# Patient Record
Sex: Male | Born: 1990 | Race: White | Hispanic: No | Marital: Single | Smoking: Current every day smoker
Health system: Southern US, Community
[De-identification: ages and names within clinical notes are randomized; demographics above are authoritative.]

## PROBLEM LIST (undated history)

## (undated) DIAGNOSIS — J9383 Other pneumothorax: Secondary | ICD-10-CM

## (undated) HISTORY — PX: LUNG SURGERY: SHX703

## (undated) HISTORY — PX: TONSILLECTOMY: SUR1361

---

## 2013-03-29 DIAGNOSIS — J939 Pneumothorax, unspecified: Secondary | ICD-10-CM | POA: Insufficient documentation

## 2013-08-23 DIAGNOSIS — F17200 Nicotine dependence, unspecified, uncomplicated: Secondary | ICD-10-CM | POA: Insufficient documentation

## 2016-07-24 ENCOUNTER — Emergency Department: Payer: No Typology Code available for payment source

## 2016-07-24 ENCOUNTER — Emergency Department
Admission: EM | Admit: 2016-07-24 | Discharge: 2016-07-24 | Disposition: A | Discharge status: Home / Self Care | Payer: No Typology Code available for payment source | Attending: Emergency Medicine | Admitting: Emergency Medicine

## 2016-07-24 DIAGNOSIS — S0101XA Laceration without foreign body of scalp, initial encounter: Secondary | ICD-10-CM

## 2016-07-24 DIAGNOSIS — F1910 Other psychoactive substance abuse, uncomplicated: Secondary | ICD-10-CM | POA: Insufficient documentation

## 2016-07-24 DIAGNOSIS — R400 Somnolence: Secondary | ICD-10-CM | POA: Diagnosis not present

## 2016-07-24 DIAGNOSIS — F17200 Nicotine dependence, unspecified, uncomplicated: Secondary | ICD-10-CM | POA: Insufficient documentation

## 2016-07-24 DIAGNOSIS — Y92410 Unspecified street and highway as the place of occurrence of the external cause: Secondary | ICD-10-CM | POA: Diagnosis not present

## 2016-07-24 DIAGNOSIS — Y999 Unspecified external cause status: Secondary | ICD-10-CM | POA: Diagnosis not present

## 2016-07-24 DIAGNOSIS — S0990XA Unspecified injury of head, initial encounter: Secondary | ICD-10-CM | POA: Diagnosis present

## 2016-07-24 DIAGNOSIS — Y939 Activity, unspecified: Secondary | ICD-10-CM | POA: Insufficient documentation

## 2016-07-24 HISTORY — DX: Other pneumothorax: J93.83

## 2016-07-24 LAB — COMPREHENSIVE METABOLIC PANEL
ALK PHOS: 63 U/L (ref 38–126)
ALT: 20 U/L (ref 17–63)
ANION GAP: 6 (ref 5–15)
AST: 29 U/L (ref 15–41)
Albumin: 4.5 g/dL (ref 3.5–5.0)
BUN: 15 mg/dL (ref 6–20)
CALCIUM: 9.3 mg/dL (ref 8.9–10.3)
CO2: 28 mmol/L (ref 22–32)
Chloride: 106 mmol/L (ref 101–111)
Creatinine, Ser: 1.59 mg/dL — ABNORMAL HIGH (ref 0.61–1.24)
GFR calc Af Amer: >60 mL/min (ref >60)
GFR, EST NON AFRICAN AMERICAN: 59 mL/min — AB (ref >60)
Glucose, Bld: 140 mg/dL — ABNORMAL HIGH (ref 65–99)
Potassium: 3.8 mmol/L (ref 3.5–5.1)
SODIUM: 140 mmol/L (ref 135–145)
TOTAL PROTEIN: 7.5 g/dL (ref 6.5–8.1)
Total Bilirubin: 0.6 mg/dL (ref 0.3–1.2)

## 2016-07-24 LAB — URINALYSIS, COMPLETE (UACMP) WITH MICROSCOPIC
BACTERIA UA: NONE SEEN
Bilirubin Urine: NEGATIVE
Glucose, UA: NEGATIVE mg/dL
HGB URINE DIPSTICK: NEGATIVE
Ketones, ur: NEGATIVE mg/dL
Leukocytes, UA: NEGATIVE
NITRITE: NEGATIVE
PROTEIN: 100 mg/dL — AB
Specific Gravity, Urine: >1.046 — ABNORMAL HIGH (ref 1.005–1.030)
pH: 6 (ref 5.0–8.0)

## 2016-07-24 LAB — CBC WITH DIFFERENTIAL/PLATELET
BASOS ABS: 0.1 10*3/uL (ref 0–0.1)
BASOS PCT: 0 %
EOS ABS: 0.1 10*3/uL (ref 0–0.7)
Eosinophils Relative: 1 %
HEMATOCRIT: 45.4 % (ref 40.0–52.0)
Hemoglobin: 15.3 g/dL (ref 13.0–18.0)
Lymphocytes Relative: 10 %
Lymphs Abs: 1.4 10*3/uL (ref 1.0–3.6)
MCH: 29.1 pg (ref 26.0–34.0)
MCHC: 33.7 g/dL (ref 32.0–36.0)
MCV: 86.6 fL (ref 80.0–100.0)
MONO ABS: 0.9 10*3/uL (ref 0.2–1.0)
Monocytes Relative: 7 %
NEUTROS ABS: 11.2 10*3/uL — AB (ref 1.4–6.5)
Neutrophils Relative %: 82 %
PLATELETS: 192 10*3/uL (ref 150–440)
RBC: 5.24 MIL/uL (ref 4.40–5.90)
RDW: 14 % (ref 11.5–14.5)
WBC: 13.7 10*3/uL — ABNORMAL HIGH (ref 3.8–10.6)

## 2016-07-24 LAB — URINE DRUG SCREEN, QUALITATIVE (ARMC ONLY)
Amphetamines, Ur Screen: NOT DETECTED
Barbiturates, Ur Screen: NOT DETECTED
Benzodiazepine, Ur Scrn: NOT DETECTED
COCAINE METABOLITE, UR ~~LOC~~: POSITIVE — AB
Cannabinoid 50 Ng, Ur ~~LOC~~: POSITIVE — AB
MDMA (ECSTASY) UR SCREEN: NOT DETECTED
Methadone Scn, Ur: NOT DETECTED
OPIATE, UR SCREEN: POSITIVE — AB
PHENCYCLIDINE (PCP) UR S: NOT DETECTED
Tricyclic, Ur Screen: NOT DETECTED

## 2016-07-24 LAB — ETHANOL

## 2016-07-24 LAB — SALICYLATE LEVEL: Salicylate Lvl: <7 mg/dL (ref 2.8–30.0)

## 2016-07-24 LAB — LIPASE, BLOOD: LIPASE: 18 U/L (ref 11–51)

## 2016-07-24 LAB — ACETAMINOPHEN LEVEL

## 2016-07-24 MED ORDER — NALOXONE HCL 2 MG/2ML IJ SOSY
PREFILLED_SYRINGE | INTRAMUSCULAR | Status: AC
  Filled 2016-07-24: qty 2

## 2016-07-24 MED ORDER — NALOXONE HCL 0.4 MG/ML IJ SOLN
0.0400 mg | Freq: Once | INTRAMUSCULAR | Status: DC
  Filled 2016-07-24: qty 1

## 2016-07-24 MED ORDER — SODIUM CHLORIDE 0.9 % IV BOLUS (SEPSIS)
1000.0000 mL | Freq: Once | INTRAVENOUS | Status: AC
  Administered 2016-07-24: 1000 mL via INTRAVENOUS

## 2016-07-24 MED ORDER — IOPAMIDOL (ISOVUE-300) INJECTION 61%
125.0000 mL | Freq: Once | INTRAVENOUS | Status: AC | PRN
  Administered 2016-07-24: 125 mL via INTRAVENOUS

## 2016-07-24 NOTE — ED Notes (Signed)
Patient continues to be somnolent, while still easily arousable. MD aware.
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
## 2016-07-24 NOTE — ED Notes (Signed)
Patient attempting to use urinal - patient falling asleep during attempt.

## 2016-07-24 NOTE — ED Notes (Signed)
Patient becoming increasingly more somnolent. MD notified

## 2016-07-24 NOTE — ED Notes (Signed)
Reviewed d/c instructions, follow-up care with patient. Pt verbalized understanding.  

## 2016-07-24 NOTE — ED Notes (Signed)
ED Provider at bedside. 
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
## 2016-07-24 NOTE — ED Notes (Signed)
Patient's O2 sat dropped to 89% on RA. Patient placed on 2L [place]. MD notified

## 2016-07-26 LAB — TYPE AND SCREEN
ABO/RH(D): A POS
Antibody Screen: NEGATIVE
PT AG Type: POSITIVE

## 2019-06-15 ENCOUNTER — Emergency Department: Payer: Self-pay

## 2019-06-15 ENCOUNTER — Emergency Department
Admission: EM | Admit: 2019-06-15 | Discharge: 2019-06-15 | Disposition: A | Discharge status: Home / Self Care | Payer: Self-pay | Attending: Emergency Medicine | Admitting: Emergency Medicine

## 2019-06-15 ENCOUNTER — Encounter: Payer: Self-pay | Admitting: Emergency Medicine

## 2019-06-15 ENCOUNTER — Other Ambulatory Visit: Payer: Self-pay

## 2019-06-15 DIAGNOSIS — F17290 Nicotine dependence, other tobacco product, uncomplicated: Secondary | ICD-10-CM | POA: Insufficient documentation

## 2019-06-15 DIAGNOSIS — S93601A Unspecified sprain of right foot, initial encounter: Secondary | ICD-10-CM | POA: Insufficient documentation

## 2019-06-15 DIAGNOSIS — Y92008 Other place in unspecified non-institutional (private) residence as the place of occurrence of the external cause: Secondary | ICD-10-CM | POA: Insufficient documentation

## 2019-06-15 DIAGNOSIS — Y999 Unspecified external cause status: Secondary | ICD-10-CM | POA: Insufficient documentation

## 2019-06-15 DIAGNOSIS — W132XXA Fall from, out of or through roof, initial encounter: Secondary | ICD-10-CM | POA: Insufficient documentation

## 2019-06-15 DIAGNOSIS — Y9389 Activity, other specified: Secondary | ICD-10-CM | POA: Insufficient documentation

## 2019-06-15 MED ORDER — TRAMADOL HCL 50 MG PO TABS: 50.0000 mg | ORAL_TABLET | Freq: Two times a day (BID) | ORAL | 0 refills | Status: AC | PRN

## 2019-06-15 MED ORDER — IBUPROFEN 600 MG PO TABS: 600.0000 mg | ORAL_TABLET | Freq: Three times a day (TID) | ORAL | 0 refills | Status: DC | PRN

## 2019-06-15 NOTE — ED Notes (Signed)
See triage note  Presents s/p fall  States he fell from ladder having pain to right foot  Good pulses

## 2019-06-15 NOTE — ED Provider Notes (Signed)
[facility] Emergency Department Provider Note   ____________________________________________   First MD Initiated Contact with Patient 06/15/19 [id number]     (approximate)  I have reviewed the triage vital signs and the nursing notes.   HISTORY  Chief Complaint Fall    HPI Albert Fuentes is a 28 y.o. male patient complain of right foot pain secondary to falling off a roof yesterday.  Patient that he landed standing on his feet and has pain to the right foot with mild edema.  Patient the pain increased with weightbearing and ambulation.  Patient rates his pain as a 7/10.  Patient described the pain as "aching".  No palliative measures for complaint.  Denies any other injury from fall.         Past Medical History:  Diagnosis Date  . Spontaneous pneumothorax     There are no active problems to display for this patient.   Past Surgical History:  Procedure Laterality Date  . LUNG SURGERY      Prior to Admission medications   Medication Sig Start Date End Date Taking? Authorizing Provider  ibuprofen (ADVIL) 600 MG tablet Take 1 tablet (600 mg total) by mouth every 8 (eight) hours as needed. 06/15/19   Joni Reining, PA-C  traMADol (ULTRAM) 50 MG tablet Take 1 tablet (50 mg total) by mouth every 12 (twelve) hours as needed for up to 3 days. 06/15/19 06/18/19  Joni Reining, PA-C    Allergies Patient has no known allergies.  No family history on file.  Social History Social History   Tobacco Use  . Smoking status: Current Every Day Smoker    Packs/day: 1.00    Years: 5.00    Pack years: 5.00  . Smokeless tobacco: Current User  Substance Use Topics  . Alcohol use: Yes    Alcohol/week: 7.0 standard drinks    Types: 2 Shots of liquor, 5 Cans of beer per week  . Drug use: Yes    Types: Marijuana    Review of Systems Constitutional: No fever/chills Eyes: No visual changes. ENT: No sore throat. Cardiovascular: Denies chest pain.  Respiratory: Denies shortness of breath. Gastrointestinal: No abdominal pain.  No nausea, no vomiting.  No diarrhea.  No constipation. Genitourinary: Negative for dysuria. Musculoskeletal: Right foot pain. Skin: Negative for rash. Neurological: Negative for headaches, focal weakness or numbness.   ____________________________________________   PHYSICAL EXAM:  VITAL SIGNS: ED Triage Vitals  Enc Vitals Group     BP 06/15/19 0859 117/78     Pulse --      Resp 06/15/19 0859 16     Temp 06/15/19 0859 98.5 F (36.9 C)     Temp Source 06/15/19 0859 Oral     SpO2 06/15/19 0859 99 %     Weight 06/15/19 0854 160 lb 0.9 oz (72.6 kg)     Height --      Head Circumference --      Peak Flow --      Pain Score 06/15/19 0854 7     Pain Loc --      Pain Edu? --      Excl. in GC? --     Constitutional: Alert and oriented. Well appearing and in no acute distress. Cardiovascular: Normal rate, regular rhythm. Grossly normal heart sounds.  Good peripheral circulation. Respiratory: Normal respiratory effort.  No retractions. Lungs CTAB. Musculoskeletal: No obvious deformity to the right foot.  Patient has mild edema dorsal aspect of foot.  Neurologic:  Normal speech and language. No gross focal neurologic deficits are appreciated. No gait instability. Skin:  Skin is warm, dry and intact. No rash noted.  No abrasion or ecchymosis of the right foot. Psychiatric: Mood and affect are normal. Speech and behavior are normal.  ____________________________________________   LABS (all labs ordered are listed, but only abnormal results are displayed)  Labs Reviewed - No data to display ____________________________________________  EKG   ____________________________________________  RADIOLOGY  ED MD interpretation:    Official radiology report(s): Dg Foot Complete Right  Result Date: 06/15/2019 CLINICAL DATA:  Right foot pain after fall yesterday. EXAM: RIGHT FOOT COMPLETE - 3+ VIEW  COMPARISON:  None. FINDINGS: There is no evidence of fracture or dislocation. There is no evidence of arthropathy or other focal bone abnormality. Soft tissues are unremarkable. IMPRESSION: Negative. Electronically Signed   By: Marijo Conception M.D.   On: 06/15/2019 09:25    ____________________________________________   PROCEDURES  Procedure(s) performed (including Critical Care):  Procedures   ____________________________________________   INITIAL IMPRESSION / ASSESSMENT AND PLAN / ED COURSE  As part of my medical decision making, I reviewed the following data within the [place] was evaluated in Emergency Department on 06/15/2019 for the symptoms described in the history of present illness. He was evaluated in the context of the global COVID-19 pandemic, which necessitated consideration that the patient might be at risk for infection with the SARS-CoV-2 virus that causes COVID-19. Institutional protocols and algorithms that pertain to the evaluation of patients at risk for COVID-19 are in a state of rapid change based on information released by regulatory bodies including the CDC and federal and state organizations. These policies and algorithms were followed during the patient's care in the ED.  Patient presents with right foot pain secondary to fall from roof.  Discussed x-ray findings with patient.  Patient foot with Ace wrap and is given crutches to assist with ambulation.  Advised to follow discharge care instructions and [facility].      ____________________________________________   FINAL CLINICAL IMPRESSION(S) / ED DIAGNOSES  Final diagnoses:  Sprain of right foot, initial encounter     ED Discharge Orders         Ordered    ibuprofen (ADVIL) 600 MG tablet  Every 8 hours PRN     06/15/19 0956    traMADol (ULTRAM) 50 MG tablet  Every 12 hours PRN     06/15/19 0956           Note:  This  document was prepared using Dragon voice recognition software and may include unintentional dictation errors.    Sable Feil, PA-C 06/15/19 Debby Freiberg    Blake Divine, MD 06/15/19 [phone]

## 2019-06-15 NOTE — ED Triage Notes (Signed)
States he fell off roof yesterday evening and landed, standing, on right leg.  C/O right foot pain.  Right foot swelling noted.  + DP.

## 2019-06-15 NOTE — Discharge Instructions (Signed)
Follow discharge care instruction take medication as directed. °

## 2019-07-13 ENCOUNTER — Other Ambulatory Visit: Payer: Self-pay

## 2019-07-13 DIAGNOSIS — Z20822 Contact with and (suspected) exposure to covid-19: Secondary | ICD-10-CM

## 2019-07-15 LAB — NOVEL CORONAVIRUS, NAA: SARS-CoV-2, NAA: NOT DETECTED

## 2020-09-20 ENCOUNTER — Other Ambulatory Visit: Payer: Self-pay

## 2020-09-20 DIAGNOSIS — F17210 Nicotine dependence, cigarettes, uncomplicated: Secondary | ICD-10-CM | POA: Diagnosis present

## 2020-09-20 DIAGNOSIS — K605 Anorectal fistula: Secondary | ICD-10-CM | POA: Diagnosis present

## 2020-09-20 DIAGNOSIS — Z597 Insufficient social insurance and welfare support: Secondary | ICD-10-CM

## 2020-09-20 DIAGNOSIS — E876 Hypokalemia: Secondary | ICD-10-CM | POA: Diagnosis present

## 2020-09-20 DIAGNOSIS — Z716 Tobacco abuse counseling: Secondary | ICD-10-CM

## 2020-09-20 DIAGNOSIS — D62 Acute posthemorrhagic anemia: Secondary | ICD-10-CM | POA: Diagnosis present

## 2020-09-20 DIAGNOSIS — K50113 Crohn's disease of large intestine with fistula: Principal | ICD-10-CM | POA: Diagnosis present

## 2020-09-20 DIAGNOSIS — Z20822 Contact with and (suspected) exposure to covid-19: Secondary | ICD-10-CM | POA: Diagnosis present

## 2020-09-20 LAB — CBC
HCT: 29.1 % — ABNORMAL LOW (ref 39.0–52.0)
Hemoglobin: 9.2 g/dL — ABNORMAL LOW (ref 13.0–17.0)
MCH: 23.1 pg — ABNORMAL LOW (ref 26.0–34.0)
MCHC: 31.6 g/dL (ref 30.0–36.0)
MCV: 73.1 fL — ABNORMAL LOW (ref 80.0–100.0)
Platelets: 377 10*3/uL (ref 150–400)
RBC: 3.98 MIL/uL — ABNORMAL LOW (ref 4.22–5.81)
RDW: 15 % (ref 11.5–15.5)
WBC: 7.1 10*3/uL (ref 4.0–10.5)
nRBC: 0 % (ref 0.0–0.2)

## 2020-09-20 LAB — COMPREHENSIVE METABOLIC PANEL
ALT: 39 U/L (ref 0–44)
AST: 39 U/L (ref 15–41)
Albumin: 1.7 g/dL — ABNORMAL LOW (ref 3.5–5.0)
Alkaline Phosphatase: 113 U/L (ref 38–126)
Anion gap: 8 (ref 5–15)
BUN: 9 mg/dL (ref 6–20)
CO2: 24 mmol/L (ref 22–32)
Calcium: 7.5 mg/dL — ABNORMAL LOW (ref 8.9–10.3)
Chloride: 98 mmol/L (ref 98–111)
Creatinine, Ser: 0.91 mg/dL (ref 0.61–1.24)
GFR, Estimated: >60 mL/min (ref >60)
Glucose, Bld: 152 mg/dL — ABNORMAL HIGH (ref 70–99)
Potassium: 3.4 mmol/L — ABNORMAL LOW (ref 3.5–5.1)
Sodium: 130 mmol/L — ABNORMAL LOW (ref 135–145)
Total Bilirubin: 0.4 mg/dL (ref 0.3–1.2)
Total Protein: 5.9 g/dL — ABNORMAL LOW (ref 6.5–8.1)

## 2020-09-20 NOTE — ED Triage Notes (Signed)
Pt states coming in due to hemorrhoids that is causing pain and he cant get comfortable and then he noticed blood in his stool. Pt also states that "between by butthole and groin there is another hole where poop is coming out of also." Pt states uncontrollable stool leackage

## 2020-09-21 ENCOUNTER — Other Ambulatory Visit: Payer: Self-pay

## 2020-09-21 ENCOUNTER — Encounter: Payer: Self-pay | Admitting: Internal Medicine

## 2020-09-21 ENCOUNTER — Inpatient Hospital Stay
Admission: EM | Admit: 2020-09-21 | Discharge: 2020-09-25 | DRG: 386 | Disposition: A | Discharge status: Home / Self Care | Payer: Self-pay | Attending: Internal Medicine | Admitting: Internal Medicine

## 2020-09-21 ENCOUNTER — Emergency Department: Payer: Self-pay

## 2020-09-21 DIAGNOSIS — K50111 Crohn's disease of large intestine with rectal bleeding: Secondary | ICD-10-CM | POA: Diagnosis present

## 2020-09-21 DIAGNOSIS — K6289 Other specified diseases of anus and rectum: Secondary | ICD-10-CM

## 2020-09-21 DIAGNOSIS — D62 Acute posthemorrhagic anemia: Secondary | ICD-10-CM | POA: Diagnosis present

## 2020-09-21 DIAGNOSIS — F17213 Nicotine dependence, cigarettes, with withdrawal: Secondary | ICD-10-CM

## 2020-09-21 DIAGNOSIS — F17200 Nicotine dependence, unspecified, uncomplicated: Secondary | ICD-10-CM | POA: Diagnosis present

## 2020-09-21 DIAGNOSIS — D649 Anemia, unspecified: Secondary | ICD-10-CM

## 2020-09-21 DIAGNOSIS — E876 Hypokalemia: Secondary | ICD-10-CM | POA: Diagnosis present

## 2020-09-21 DIAGNOSIS — K603 Anal fistula: Secondary | ICD-10-CM

## 2020-09-21 DIAGNOSIS — K625 Hemorrhage of anus and rectum: Secondary | ICD-10-CM

## 2020-09-21 DIAGNOSIS — K529 Noninfective gastroenteritis and colitis, unspecified: Secondary | ICD-10-CM

## 2020-09-21 LAB — URINALYSIS, ROUTINE W REFLEX MICROSCOPIC
Bilirubin Urine: NEGATIVE
Glucose, UA: NEGATIVE mg/dL
Hgb urine dipstick: NEGATIVE
Ketones, ur: NEGATIVE mg/dL
Leukocytes,Ua: NEGATIVE
Nitrite: NEGATIVE
Protein, ur: NEGATIVE mg/dL
Specific Gravity, Urine: 1.018 (ref 1.005–1.030)
pH: 7 (ref 5.0–8.0)

## 2020-09-21 LAB — URINE DRUG SCREEN, QUALITATIVE (ARMC ONLY)
Amphetamines, Ur Screen: NOT DETECTED
Barbiturates, Ur Screen: NOT DETECTED
Benzodiazepine, Ur Scrn: NOT DETECTED
Cannabinoid 50 Ng, Ur ~~LOC~~: NOT DETECTED
Cocaine Metabolite,Ur ~~LOC~~: NOT DETECTED
MDMA (Ecstasy)Ur Screen: NOT DETECTED
Methadone Scn, Ur: NOT DETECTED
Opiate, Ur Screen: POSITIVE — AB
Phencyclidine (PCP) Ur S: NOT DETECTED
Tricyclic, Ur Screen: NOT DETECTED

## 2020-09-21 LAB — TYPE AND SCREEN
ABO/RH(D): A POS
Antibody Screen: NEGATIVE

## 2020-09-21 LAB — RESP PANEL BY RT-PCR (FLU A&B, COVID) ARPGX2
Influenza A by PCR: NEGATIVE
Influenza B by PCR: NEGATIVE
SARS Coronavirus 2 by RT PCR: NEGATIVE

## 2020-09-21 LAB — LACTIC ACID, PLASMA: Lactic Acid, Venous: 0.8 mmol/L (ref 0.5–1.9)

## 2020-09-21 LAB — PROTIME-INR
INR: 1.3 — ABNORMAL HIGH (ref 0.8–1.2)
Prothrombin Time: 15.4 seconds — ABNORMAL HIGH (ref 11.4–15.2)

## 2020-09-21 MED ORDER — BISACODYL 5 MG PO TBEC
10.0000 mg | DELAYED_RELEASE_TABLET | Freq: Once | ORAL | Status: AC
  Administered 2020-09-21: 10 mg via ORAL
  Filled 2020-09-21: qty 2

## 2020-09-21 MED ORDER — ONDANSETRON HCL 4 MG/2ML IJ SOLN: 4.0000 mg | Freq: Four times a day (QID) | INTRAMUSCULAR | Status: DC | PRN

## 2020-09-21 MED ORDER — ONDANSETRON HCL 4 MG/2ML IJ SOLN
4.0000 mg | Freq: Once | INTRAMUSCULAR | Status: AC
  Administered 2020-09-21: 4 mg via INTRAVENOUS
  Filled 2020-09-21: qty 2

## 2020-09-21 MED ORDER — HYDROMORPHONE HCL 1 MG/ML IJ SOLN
1.0000 mg | Freq: Once | INTRAMUSCULAR | Status: AC
Start: 2020-09-21 — End: 2020-09-21
  Administered 2020-09-21: 1 mg via INTRAVENOUS
  Filled 2020-09-21: qty 1

## 2020-09-21 MED ORDER — PEG 3350-KCL-NA BICARB-NACL 420 G PO SOLR
4000.0000 mL | Freq: Once | ORAL | Status: AC
  Administered 2020-09-21: 4000 mL via ORAL
  Filled 2020-09-21: qty 4000

## 2020-09-21 MED ORDER — IOHEXOL 300 MG/ML  SOLN
100.0000 mL | Freq: Once | INTRAMUSCULAR | Status: AC | PRN
  Administered 2020-09-21: 100 mL via INTRAVENOUS

## 2020-09-21 MED ORDER — POTASSIUM CHLORIDE IN NACL 40-0.9 MEQ/L-% IV SOLN
INTRAVENOUS | Status: DC
  Filled 2020-09-21 (×12): qty 1000

## 2020-09-21 MED ORDER — IOHEXOL 12 MG/ML PO SOLN
500.0000 mL | ORAL | Status: AC
  Administered 2020-09-21: 500 mL via ORAL

## 2020-09-21 MED ORDER — SODIUM CHLORIDE 0.9 % IV SOLN: INTRAVENOUS | Status: AC

## 2020-09-21 MED ORDER — ONDANSETRON HCL 4 MG PO TABS: 4.0000 mg | ORAL_TABLET | Freq: Four times a day (QID) | ORAL | Status: DC | PRN

## 2020-09-21 MED ORDER — MORPHINE SULFATE (PF) 2 MG/ML IV SOLN
2.0000 mg | INTRAVENOUS | Status: DC | PRN
  Administered 2020-09-21 – 2020-09-25 (×25): 2 mg via INTRAVENOUS
  Filled 2020-09-21 (×25): qty 1

## 2020-09-21 MED ORDER — MORPHINE SULFATE (PF) 4 MG/ML IV SOLN
4.0000 mg | Freq: Once | INTRAVENOUS | Status: AC
  Administered 2020-09-21: 4 mg via INTRAVENOUS
  Filled 2020-09-21: qty 1

## 2020-09-21 NOTE — H&P (View-Only) (Signed)
Melodie Bouillon, MD [address], [place], [place], [address] [address], [address], [place], [place], [address] Phone: [phone]  Fax: [phone]  Consultation  Referring Provider:     Dr. Para March Primary Care Physician:  Patient, No Pcp Per Reason for Consultation:     BRBPR  Date of Admission:  09/21/2020 Date of Consultation:  09/21/2020         HPI:   Albert Fuentes is a 30 y.o. male who presents with 1 week history of bright red blood per rectum and rectal pain.  Patient reports 1 year history of loose stool with intermittent bright red blood per rectum.  Reports 1 loose stool daily associated with blood streaks.  However, for the last week he reports having rectal pain and straining and red blood per rectum even without a bowel movement.  No prior EGD or colonoscopy.  Denies any abdominal pain, nausea or vomiting.  Does reports fatigue.  Mother at bedside and has history of Crohn's disease.  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  Past Medical History:  Diagnosis Date  . Spontaneous pneumothorax     Past Surgical History:  Procedure Laterality Date  . LUNG SURGERY      Prior to Admission medications   Medication Sig Start Date End Date Taking? Authorizing Provider  acetaminophen (TYLENOL) 500 MG tablet Take 1,000 mg by mouth every 6 (six) hours as needed. Do not exceed 4 grams in 24 hours. 03/15/17  Yes [provider]  ibuprofen (ADVIL) 200 MG tablet Take 200 mg by mouth every 6 (six) hours as needed.   Yes [provider]    Family History  Problem Relation Age of Onset  . Crohn's disease Mother      Social History   Tobacco Use  . Smoking status: Current Every Day Smoker    Packs/day: 1.00    Years: 5.00    Pack years: 5.00  . Smokeless tobacco: Current User  Substance Use Topics  . Alcohol use: Yes    Alcohol/week: 7.0 standard drinks    Types: 5 Cans of beer, 2 Shots of  liquor per week  . Drug use: Yes    Types: Marijuana    Allergies as of 09/20/2020  . (No Known Allergies)    Review of Systems:    All systems reviewed and negative except where noted in HPI.   Physical Exam:  Vital signs in last 24 hours: Vitals:   09/21/20 0530 09/21/20 0738 09/21/20 0902 09/21/20 1115  BP: 114/73 111/73 117/68 107/64  Pulse: 74 77 79 69  Resp: 16 18 16 16   Temp:  97.8 F (36.6 C) 98 F (36.7 C) 98.2 F (36.8 C)  TempSrc:  Oral Oral Oral  SpO2: 98% 100% 99% 98%  Weight:      Height:       Last BM Date: 09/21/20 General:   Pleasant, cooperative in NAD Head:  Normocephalic and atraumatic. Eyes:   No icterus.   Conjunctiva pink. PERRLA. Ears:  Normal auditory acuity. Neck:  Supple; no masses or thyroidomegaly Lungs: Respirations even and unlabored. Lungs clear to auscultation bilaterally.   No wheezes, crackles, or rhonchi.  Abdomen:  Soft, nondistended, nontender. Normal bowel sounds. No appreciable masses or hepatomegaly.  No rebound or guarding.  Rectal exam: perianal tenderness present while trying to examine the patient and patient did not allow for a complete exam Neurologic:  Alert and oriented x3;  grossly normal neurologically. Skin:  Intact without significant lesions or rashes. Cervical Nodes:  No significant cervical adenopathy. Psych:  Alert and cooperative. Normal affect.  LAB RESULTS: Recent Labs    09/20/20 2255  WBC 7.1  HGB 9.2*  HCT 29.1*  PLT 377   BMET Recent Labs    09/20/20 2255  NA 130*  K 3.4*  CL 98  CO2 24  GLUCOSE 152*  BUN 9  CREATININE 0.91  CALCIUM 7.5*   LFT Recent Labs    09/20/20 2255  PROT 5.9*  ALBUMIN 1.7*  AST 39  ALT 39  ALKPHOS 113  BILITOT 0.4   PT/INR Recent Labs    09/21/20 0255  LABPROT 15.4*  INR 1.3*    STUDIES: CT ABDOMEN PELVIS W CONTRAST  Result Date: 09/21/2020 CLINICAL DATA:  Inflammatory bowel disease. Hemorrhoids. Anal fistula EXAM: CT ABDOMEN AND PELVIS WITH  CONTRAST TECHNIQUE: Multidetector CT imaging of the abdomen and pelvis was performed using the standard protocol following bolus administration of intravenous contrast. CONTRAST:  OMNIPAQUE IOHEXOL 300 MG/ML  SOLN COMPARISON:  07/24/2016 FINDINGS: Lower chest:  No contributory findings. Hepatobiliary: No focal liver abnormality.No evidence of biliary obstruction or stone. Pancreas: Unremarkable. Spleen: Unremarkable. Adrenals/Urinary Tract: Negative adrenals. No hydronephrosis or stone. Unremarkable bladder. Stomach/Bowel: Rectosigmoid and descending colonic wall thickening with hyperenhancing mucosa and submucosal low-density edematous appearance. The associated mesentery shows prominent vessels and mild fat stranding. 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 Relationship to the sphincter is uncertain on this study. Vascular/Lymphatic: Mild prominence of retroperitoneal and mesenteric lymph nodes considered reactive in this setting. No vascular findings. Reproductive:Edematous appearance to the scrotal wall, presumably reactive. Other: No ascites or pneumoperitoneum. Musculoskeletal: No acute abnormalities. No visible spondyloarthropathy. IMPRESSION: 1. Distal colitis correlating with history of inflammatory bowel disease. 2. 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 Electronically Signed   By: Marnee Spring M.D.   On: 09/21/2020 04:56      Impression / Plan:   Albert Fuentes is a 30 y.o. y/o male with 1 year history of loose stool associated with blood streaks, with frank hematochezia over the last week associated with rectal pain  Rectal exam inconclusive as patient had pain during just the perineal exam.  Please see rectal exam noted by ER attending  Given 1 year history of symptoms, and CT showing distal colitis, with also a perianal fistula, will need to evaluate for inflammatory bowel disease  We will plan on colonoscopy tomorrow Clear  liquid diet today Colonoscopy prep ordered  Dr. Norma Fredrickson will be doing the colonoscopy tomorrow and seeing the patient over the weekend  I have discussed alternative options, risks & benefits,  which include, but are not limited to, bleeding, infection, perforation,respiratory complication & drug reaction.  The patient agrees with this plan & written consent will be obtained.    Would recommend surgery consult at this time as well given perianal fistula with pain  Thank you for involving me in the care of this patient.      LOS: 0 days   Pasty Spillers, MD  09/21/2020, 2:54 PM

## 2020-09-21 NOTE — ED Notes (Signed)
Pt given oral contrast by CT tech to drink. Pt encouraged to drink and given call bell to notify this RN once he was completed with the contrast.

## 2020-09-21 NOTE — Consult Note (Signed)
  Albert Wayment, MD [address], [address], , [place], [address] [address], [address], [place], [place], [address] Phone: [phone]  Fax: [phone]  Consultation  Referring Provider:     Dr. Duncan Primary Care Physician:  Patient, No Pcp Per Reason for Consultation:     BRBPR  Date of Admission:  09/21/2020 Date of Consultation:  09/21/2020         HPI:   Albert Fuentes is a 30 y.o. male who presents with 1 week history of bright red blood per rectum and rectal pain.  Patient reports 1 year history of loose stool with intermittent bright red blood per rectum.  Reports 1 loose stool daily associated with blood streaks.  However, for the last week he reports having rectal pain and straining and red blood per rectum even without a bowel movement.  No prior EGD or colonoscopy.  Denies any abdominal pain, nausea or vomiting.  Does reports fatigue.  Mother at bedside and has history of Crohn's disease.  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  Past Medical History:  Diagnosis Date  . Spontaneous pneumothorax     Past Surgical History:  Procedure Laterality Date  . LUNG SURGERY      Prior to Admission medications   Medication Sig Start Date End Date Taking? Authorizing Provider  acetaminophen (TYLENOL) 500 MG tablet Take 1,000 mg by mouth every 6 (six) hours as needed. Do not exceed 4 grams in 24 hours. 03/15/17  Yes [provider]  ibuprofen (ADVIL) 200 MG tablet Take 200 mg by mouth every 6 (six) hours as needed.   Yes [provider]    Family History  Problem Relation Age of Onset  . Crohn's disease Mother      Social History   Tobacco Use  . Smoking status: Current Every Day Smoker    Packs/day: 1.00    Years: 5.00    Pack years: 5.00  . Smokeless tobacco: Current User  Substance Use Topics  . Alcohol use: Yes    Alcohol/week: 7.0 standard drinks    Types: 5 Cans of beer, 2 Shots of  liquor per week  . Drug use: Yes    Types: Marijuana    Allergies as of 09/20/2020  . (No Known Allergies)    Review of Systems:    All systems reviewed and negative except where noted in HPI.   Physical Exam:  Vital signs in last 24 hours: Vitals:   09/21/20 0530 09/21/20 0738 09/21/20 0902 09/21/20 1115  BP: 114/73 111/73 117/68 107/64  Pulse: 74 77 79 69  Resp: 16 18 16 16  Temp:  97.8 F (36.6 C) 98 F (36.7 C) 98.2 F (36.8 C)  TempSrc:  Oral Oral Oral  SpO2: 98% 100% 99% 98%  Weight:      Height:       Last BM Date: 09/21/20 General:   Pleasant, cooperative in NAD Head:  Normocephalic and atraumatic. Eyes:   No icterus.   Conjunctiva pink. PERRLA. Ears:  Normal auditory acuity. Neck:  Supple; no masses or thyroidomegaly Lungs: Respirations even and unlabored. Lungs clear to auscultation bilaterally.   No wheezes, crackles, or rhonchi.  Abdomen:  Soft, nondistended, nontender. Normal bowel sounds. No appreciable masses or hepatomegaly.  No rebound or guarding.  Rectal exam: perianal tenderness present while trying to examine the patient and patient did not allow for a complete exam Neurologic:  Alert and oriented x3;    grossly normal neurologically. Skin:  Intact without significant lesions or rashes. Cervical Nodes:  No significant cervical adenopathy. Psych:  Alert and cooperative. Normal affect.  LAB RESULTS: Recent Labs    09/20/20 2255  WBC 7.1  HGB 9.2*  HCT 29.1*  PLT 377   BMET Recent Labs    09/20/20 2255  NA 130*  K 3.4*  CL 98  CO2 24  GLUCOSE 152*  BUN 9  CREATININE 0.91  CALCIUM 7.5*   LFT Recent Labs    09/20/20 2255  PROT 5.9*  ALBUMIN 1.7*  AST 39  ALT 39  ALKPHOS 113  BILITOT 0.4   PT/INR Recent Labs    09/21/20 0255  LABPROT 15.4*  INR 1.3*    STUDIES: CT ABDOMEN PELVIS W CONTRAST  Result Date: 09/21/2020 CLINICAL DATA:  Inflammatory bowel disease. Hemorrhoids. Anal fistula EXAM: CT ABDOMEN AND PELVIS WITH  CONTRAST TECHNIQUE: Multidetector CT imaging of the abdomen and pelvis was performed using the standard protocol following bolus administration of intravenous contrast. CONTRAST:  OMNIPAQUE IOHEXOL 300 MG/ML  SOLN COMPARISON:  07/24/2016 FINDINGS: Lower chest:  No contributory findings. Hepatobiliary: No focal liver abnormality.No evidence of biliary obstruction or stone. Pancreas: Unremarkable. Spleen: Unremarkable. Adrenals/Urinary Tract: Negative adrenals. No hydronephrosis or stone. Unremarkable bladder. Stomach/Bowel: Rectosigmoid and descending colonic wall thickening with hyperenhancing mucosa and submucosal low-density edematous appearance. The associated mesentery shows prominent vessels and mild fat stranding. 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 Relationship to the sphincter is uncertain on this study. Vascular/Lymphatic: Mild prominence of retroperitoneal and mesenteric lymph nodes considered reactive in this setting. No vascular findings. Reproductive:Edematous appearance to the scrotal wall, presumably reactive. Other: No ascites or pneumoperitoneum. Musculoskeletal: No acute abnormalities. No visible spondyloarthropathy. IMPRESSION: 1. Distal colitis correlating with history of inflammatory bowel disease. 2. 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 Electronically Signed   By: Marnee Spring M.D.   On: 09/21/2020 04:56      Impression / Plan:   [name] is a 30 y.o. y/o male with 1 year history of loose stool associated with blood streaks, with frank hematochezia over the last week associated with rectal pain  Rectal exam inconclusive as patient had pain during just the perineal exam.  Please see rectal exam noted by ER attending  Given 1 year history of symptoms, and CT showing distal colitis, with also a perianal fistula, will need to evaluate for inflammatory bowel disease  We will plan on colonoscopy tomorrow Clear  liquid diet today Colonoscopy prep ordered  Dr. Norma Fredrickson will be doing the colonoscopy tomorrow and seeing the patient over the weekend  I have discussed alternative options, risks & benefits,  which include, but are not limited to, bleeding, infection, perforation,respiratory complication & drug reaction.  The patient agrees with this plan & written consent will be obtained.    Would recommend surgery consult at this time as well given perianal fistula with pain  Thank you for involving me in the care of this patient.      LOS: 0 days   Pasty Spillers, MD  09/21/2020, 2:54 PM

## 2020-09-21 NOTE — ED Provider Notes (Signed)
[facility] Emergency Department Provider Note  ____________________________________________   Event Date/Time   First MD Initiated Contact with Patient 09/21/20 [phone]     (approximate)  I have reviewed the triage vital signs and the nursing notes.   HISTORY  Chief Complaint Hemorrhoids and Abscess (Possible abscess)    HPI Albert Fuentes is a 30 y.o. male with history of previous opioid overdose, spontaneous pneumothorax who presents to the emergency department with rectal pain for the past several days and rectal bleeding for 1 week.  Describes bright red blood per rectum even without bowel movements.  No melena.  Mother with history of Crohn's disease.  He has no known history of inflammatory bowel disease.  He denies abdominal pain, nausea, vomiting.  Mother reports he has been having diarrhea.  He is not on blood thinners.  No previous abdominal surgery.  He is unsure of what his hemoglobin normally runs.  He states he is constantly leaking stool.  His mother was concerned that he could have a fistula.        Past Medical History:  Diagnosis Date  . Spontaneous pneumothorax     There are no problems to display for this patient.   Past Surgical History:  Procedure Laterality Date  . LUNG SURGERY      Prior to Admission medications   Medication Sig Start Date End Date Taking? Authorizing Provider  acetaminophen (TYLENOL) 500 MG tablet Take 1,000 mg by mouth every 6 (six) hours as needed. Do not exceed 4 grams in 24 hours. 03/15/17  Yes [provider]  ibuprofen (ADVIL) 200 MG tablet Take 200 mg by mouth every 6 (six) hours as needed.   Yes [provider]    Allergies Patient has no known allergies.  No family history on file.  Social History Social History   Tobacco Use  . Smoking status: Current Every Day Smoker    Packs/day: 1.00    Years: 5.00    Pack years: 5.00  . Smokeless tobacco: Current User  Substance Use  Topics  . Alcohol use: Yes    Alcohol/week: 7.0 standard drinks    Types: 2 Shots of liquor, 5 Cans of beer per week  . Drug use: Yes    Types: Marijuana    Review of Systems Constitutional: No fever. Eyes: No visual changes. ENT: No sore throat. Cardiovascular: Denies chest pain. Respiratory: Denies shortness of breath. Gastrointestinal: No nausea, vomiting.  + diarrhea. Genitourinary: Negative for dysuria. Musculoskeletal: Negative for back pain. Skin: Negative for rash. Neurological: Negative for focal weakness or numbness.  ____________________________________________   PHYSICAL EXAM:  VITAL SIGNS: ED Triage Vitals [09/20/20 2252]  Enc Vitals Group     BP 133/76     Pulse Rate (!) 102     Resp 20     Temp 98.6 F (37 C)     Temp src      SpO2 98 %     Weight 150 lb (68 kg)     Height 6\' 2"  (1.88 m)     Head Circumference      Peak Flow      Pain Score 8     Pain Loc      Pain Edu?      Excl. in GC?    CONSTITUTIONAL: Alert and oriented and responds appropriately to questions.  Thin, chronically ill-appearing. HEAD: Normocephalic EYES: Conjunctivae clear, pupils appear equal, EOM appear intact ENT: normal nose; moist mucous membranes NECK: Supple,  normal ROM CARD: RRR; S1 and S2 appreciated; no murmurs, no clicks, no rubs, no gallops RESP: Normal chest excursion without splinting or tachypnea; breath sounds clear and equal bilaterally; no wheezes, no rhonchi, no rales, no hypoxia or respiratory distress, speaking full sentences ABD/GI: Normal bowel sounds; non-distended; soft, non-tender, no rebound, no guarding, no peritoneal signs, no hepatosplenomegaly RECTAL:  Normal rectal tone, + gross blood, no melena, patient has 1 nonthrombosed nonbleeding external hemorrhoid on exam, no rectal or perianal abscess, very tender to palpation externally and internally, small fistula tracts noted around the anus without drainage or bleeding, no fecal impaction. Chaperone  present. BACK: The back appears normal EXT: Normal ROM in all joints; no deformity noted, no edema; no cyanosis SKIN: Normal color for age and race; warm; no rash on exposed skin NEURO: Moves all extremities equally PSYCH: The patient's mood and manner are appropriate.  ____________________________________________   LABS (all labs ordered are listed, but only abnormal results are displayed)  Labs Reviewed  COMPREHENSIVE METABOLIC PANEL - Abnormal; Notable for the following components:      Result Value   Sodium 130 (*)    Potassium 3.4 (*)    Glucose, Bld 152 (*)    Calcium 7.5 (*)    Total Protein 5.9 (*)    Albumin 1.7 (*)    All other components within normal limits  CBC - Abnormal; Notable for the following components:   RBC 3.98 (*)    Hemoglobin 9.2 (*)    HCT 29.1 (*)    MCV 73.1 (*)    MCH 23.1 (*)    All other components within normal limits  PROTIME-INR - Abnormal; Notable for the following components:   Prothrombin Time 15.4 (*)    INR 1.3 (*)    All other components within normal limits  RESP PANEL BY RT-PCR (FLU A&B, COVID) ARPGX2  URINALYSIS, ROUTINE W REFLEX MICROSCOPIC  URINE DRUG SCREEN, QUALITATIVE ([facility] ONLY)  LACTIC ACID, PLASMA  TYPE AND SCREEN   ____________________________________________  EKG  none ____________________________________________  RADIOLOGY I, [name], personally viewed and evaluated these images (plain radiographs) as part of my medical decision making, as well as reviewing the written report by the radiologist.  ED MD interpretation:  Colitis.  Official radiology report(s): CT ABDOMEN PELVIS W CONTRAST  Result Date: 09/21/2020 CLINICAL DATA:  Inflammatory bowel disease. Hemorrhoids. Anal fistula EXAM: CT ABDOMEN AND PELVIS WITH CONTRAST TECHNIQUE: Multidetector CT imaging of the abdomen and pelvis was performed using the standard protocol following bolus administration of intravenous contrast. CONTRAST:  OMNIPAQUE  IOHEXOL 300 MG/ML  SOLN COMPARISON:  07/24/2016 FINDINGS: Lower chest:  No contributory findings. Hepatobiliary: No focal liver abnormality.No evidence of biliary obstruction or stone. Pancreas: Unremarkable. Spleen: Unremarkable. Adrenals/Urinary Tract: Negative adrenals. No hydronephrosis or stone. Unremarkable bladder. Stomach/Bowel: Rectosigmoid and descending colonic wall thickening with hyperenhancing mucosa and submucosal low-density edematous appearance. The associated mesentery shows prominent vessels and mild fat stranding. 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 Relationship to the sphincter is uncertain on this study. Vascular/Lymphatic: Mild prominence of retroperitoneal and mesenteric lymph nodes considered reactive in this setting. No vascular findings. Reproductive:Edematous appearance to the scrotal wall, presumably reactive. Other: No ascites or pneumoperitoneum. Musculoskeletal: No acute abnormalities. No visible spondyloarthropathy. IMPRESSION: 1. Distal colitis correlating with history of inflammatory bowel disease. 2. 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 Electronically Signed   By: Marnee Spring M.D.   On:  09/21/2020 04:56    ____________________________________________   PROCEDURES  Procedure(s) performed (including Critical Care):  Procedures    ____________________________________________   INITIAL IMPRESSION / ASSESSMENT AND PLAN / ED COURSE  As part of my medical decision making, I reviewed the following data within the electronic MEDICAL RECORD NUMBER History obtained from family, Nursing notes reviewed and incorporated, Labs reviewed, Notes from prior ED visits and [place] Controlled Substance Database         Patient here with rectal pain and rectal bleeding.  His hemoglobin is 9.2.  We have no old for comparison.  He does have a significant of bright red blood on rectal exam but is not actively hemorrhaging.   He is hemodynamically stable.  His mother has a history of Crohn's disease and I am concerned this could be from inflammatory bowel disease.  He does have one nonthrombosed external hemorrhoid on exam that is not bleeding.  No signs of rectal abscess.  We will proceed with CT of the abdomen pelvis to evaluate for inflammatory bowel disease, fistula, abscess, prostatitis, prostatitis, diverticulosis.  I feel he would likely need admission given his anemia and rectal bleeding.  Will give IV fluids, pain and nausea medicine.  ED PROGRESS  Patient CT scan shows distal colitis which I suspect is likely due to inflammatory bowel disease.  Low suspicion that this is infectious in nature.  He has no leukocytosis.  No fever.  Does have a family history of Crohn's disease.  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  Given his rectal bleeding and anemia, will admit.  I feel patient will need GI consultation while in the hospital.  6:10 AM  Spoke with Dr. Tobi Bastos with gastroenterology.  GI will see patient in the hospital.  Recommends holding antibiotics and steroids at this time until patient has been assessed by GI.  6:25 AM Discussed patient's case with hospitalist, Dr. Para March.  I have recommended admission and patient (and family if present) agree with this plan. Admitting physician will place admission orders.   I reviewed all nursing notes, vitals, pertinent previous records and reviewed/interpreted all EKGs, lab and urine results, imaging (as available).   ____________________________________________   FINAL CLINICAL IMPRESSION(S) / ED DIAGNOSES  Final diagnoses:  Rectal bleeding  Rectal pain  Anemia, unspecified type  Colitis     ED Discharge Orders    None      *Please note:  Albert Fuentes was evaluated in Emergency Department on 09/21/2020 for the symptoms described in the history of present illness. He was evaluated in the context of the global COVID-19 pandemic, which  necessitated consideration that the patient might be at risk for infection with the SARS-CoV-2 virus that causes COVID-19. Institutional protocols and algorithms that pertain to the evaluation of patients at risk for COVID-19 are in a state of rapid change based on information released by regulatory bodies including the CDC and federal and state organizations. These policies and algorithms were followed during the patient's care in the ED.  Some ED evaluations and interventions may be delayed as a result of limited staffing during and the pandemic.*   Note:  This document was prepared using Dragon voice recognition software and may include unintentional dictation errors.   Albert Fuentes, Layla Maw, DO 09/21/20 [phone]

## 2020-09-21 NOTE — ED Notes (Signed)
This RN to bedside, introduced self to patient, pt resting in bed with lights dimmed for comfort at this time, pt c/o pain, requesting pain medication. Explained will message admitting MD regarding request.

## 2020-09-21 NOTE — H&P (Signed)
History and Physical    Albert Fuentes CBJ:[id number] DOB: Dec 15, 1990 DOA: 09/21/2020  PCP: Patient, No Pcp Per   Patient coming from: Home  I have personally briefly reviewed patient's old medical records in [facility]  Chief Complaint: Rectal pain and bleeding  HPI: Albert Fuentes is a 30 y.o. male with medical history significant for nicotine dependence, previous opioid overdose and spontaneous pneumothorax who presents to the ER for evaluation of rectal pain and incontinence of stool and bright red blood.  Patient thought his symptoms were related to his known hemorrhoids but got concerned when he developed incontinence of stool.  He has no known history of inflammatory bowel disease but mother has a history of Crohn's disease. Patient states that he has had symptoms for about a week and that the symptoms have progressively worsened. He denies having any rectal trauma, no fever, no chills, no abdominal pain, no weakness, no dizziness, no lightheadedness, no nausea, no vomiting, no headache, no blurred vision, no chest pain, no shortness of breath, no palpitations. Labs show sodium 130, potassium 3.4, chloride 98, bicarb 24, glucose 152, BUN 9, creatinine 0.91, calcium 7.5, alkaline phosphatase 113, albumin 1.7, AST 39, ALT 39, total protein 5.9, lactic acid 0.8, hemoglobin 9.2, hematocrit 29.1, MCV 73.1, RDW 15, platelet count 377, PT 15.4, INR 1.3 Respiratory viral panel is negative CT scan of abdomen and pelvis shows distal colitis correlating with history of inflammatory bowel disease.  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    ED Course: Patient is a 30 year old Caucasian male who presents to the ER for evaluation of rectal pain associated with incontinence of stool and bright red blood.  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  GI has been consulted and recommends not to give patient any antibiotics or steroids until seen by GI.   Patient will be admitted to the hospital for further evaluation.    Review of Systems: As per HPI otherwise all other systems reviewed and negative.    Past Medical History:  Diagnosis Date  . Spontaneous pneumothorax     Past Surgical History:  Procedure Laterality Date  . LUNG SURGERY       reports that he has been smoking. He has a 5.00 pack-year smoking history. He uses smokeless tobacco. He reports current alcohol use of about 7.0 standard drinks of alcohol per week. He reports current drug use. Drug: Marijuana.  No Known Allergies  Family History  Problem Relation Age of Onset  . Crohn's disease Mother       Prior to Admission medications   Medication Sig Start Date End Date Taking? Authorizing Provider  acetaminophen (TYLENOL) 500 MG tablet Take 1,000 mg by mouth every 6 (six) hours as needed. Do not exceed 4 grams in 24 hours. 03/15/17  Yes [provider]  ibuprofen (ADVIL) 200 MG tablet Take 200 mg by mouth every 6 (six) hours as needed.   Yes [provider]    Physical Exam: Vitals:   09/21/20 0400 09/21/20 0500 09/21/20 0530 09/21/20 0738  BP: 109/71 109/77 114/73 111/73  Pulse: 83 88 74 77  Resp: 16 17 16 18   Temp:    97.8 F (36.6 C)  TempSrc:    Oral  SpO2: 100% 100% 98% 100%  Weight:      Height:         Vitals:   09/21/20 0400 09/21/20 0500 09/21/20 0530 09/21/20 0738  BP: 109/71 109/77  114/73 111/73  Pulse: 83 88 74 77  Resp: 16 17 16 18   Temp:    97.8 F (36.6 C)  TempSrc:    Oral  SpO2: 100% 100% 98% 100%  Weight:      Height:          Constitutional: Alert and oriented x 3 . Not in any apparent distress.  Appears thin and frail HEENT:      Head: Normocephalic and atraumatic.         Eyes: PERLA, EOMI, Conjunctivae are normal. Sclera is non-icteric.       Mouth/Throat: Mucous membranes are moist.       Neck: Supple with no signs of meningismus. Cardiovascular: Regular rate and rhythm. No murmurs, gallops, or  rubs. 2+ symmetrical distal pulses are present . No JVD. No LE edema Respiratory: Respiratory effort normal .Lungs sounds clear bilaterally. No wheezes, crackles, or rhonchi.  Gastrointestinal: Soft, non tender, and non distended with positive bowel sounds.  Genitourinary: No CVA tenderness. Musculoskeletal: Nontender with normal range of motion in all extremities. No cyanosis, or erythema of extremities. Neurologic:  Face is symmetric. Moving all extremities. No gross focal neurologic deficits  Skin: Skin is warm, dry.  No rash or ulcers Psychiatric: Mood and affect are normal   Labs on Admission: I have personally reviewed following labs and imaging studies  CBC: Recent Labs  Lab 09/20/20 2255  WBC 7.1  HGB 9.2*  HCT 29.1*  MCV 73.1*  PLT 377   Basic Metabolic Panel: Recent Labs  Lab 09/20/20 2255  NA 130*  K 3.4*  CL 98  CO2 24  GLUCOSE 152*  BUN 9  CREATININE 0.91  CALCIUM 7.5*   GFR: Estimated Creatinine Clearance: 116.2 mL/min (by C-G formula based on SCr of 0.91 mg/dL). Liver Function Tests: Recent Labs  Lab 09/20/20 2255  AST 39  ALT 39  ALKPHOS 113  BILITOT 0.4  PROT 5.9*  ALBUMIN 1.7*   No results for input(s): LIPASE, AMYLASE in the last 168 hours. No results for input(s): AMMONIA in the last 168 hours. Coagulation Profile: Recent Labs  Lab 09/21/20 0255  INR 1.3*   Cardiac Enzymes: No results for input(s): CKTOTAL, CKMB, CKMBINDEX, TROPONINI in the last 168 hours. BNP (last 3 results) No results for input(s): PROBNP in the last 8760 hours. HbA1C: No results for input(s): HGBA1C in the last 72 hours. CBG: No results for input(s): GLUCAP in the last 168 hours. Lipid Profile: No results for input(s): CHOL, HDL, LDLCALC, TRIG, CHOLHDL, LDLDIRECT in the last 72 hours. Thyroid Function Tests: No results for input(s): TSH, T4TOTAL, FREET4, T3FREE, THYROIDAB in the last 72 hours. Anemia Panel: No results for input(s): VITAMINB12, FOLATE,  FERRITIN, TIBC, IRON, RETICCTPCT in the last 72 hours. Urine analysis:    Component Value Date/Time   COLORURINE YELLOW (A) 07/24/2016 1901   APPEARANCEUR CLEAR (A) 07/24/2016 1901   LABSPEC >1.046 (H) 07/24/2016 1901   PHURINE 6.0 07/24/2016 1901   GLUCOSEU NEGATIVE 07/24/2016 1901   HGBUR NEGATIVE 07/24/2016 1901   BILIRUBINUR NEGATIVE 07/24/2016 1901   KETONESUR NEGATIVE 07/24/2016 1901   PROTEINUR 100 (A) 07/24/2016 1901   NITRITE NEGATIVE 07/24/2016 1901   LEUKOCYTESUR NEGATIVE 07/24/2016 1901    Radiological Exams on Admission: CT ABDOMEN PELVIS W CONTRAST  Result Date: 09/21/2020 CLINICAL DATA:  Inflammatory bowel disease. Hemorrhoids. Anal fistula EXAM: CT ABDOMEN AND PELVIS WITH CONTRAST TECHNIQUE: Multidetector CT imaging of the abdomen and pelvis was performed using the standard protocol following  bolus administration of intravenous contrast. CONTRAST:  OMNIPAQUE IOHEXOL 300 MG/ML  SOLN COMPARISON:  07/24/2016 FINDINGS: Lower chest:  No contributory findings. Hepatobiliary: No focal liver abnormality.No evidence of biliary obstruction or stone. Pancreas: Unremarkable. Spleen: Unremarkable. Adrenals/Urinary Tract: Negative adrenals. No hydronephrosis or stone. Unremarkable bladder. Stomach/Bowel: Rectosigmoid and descending colonic wall thickening with hyperenhancing mucosa and submucosal low-density edematous appearance. The associated mesentery shows prominent vessels and mild fat stranding. 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 Relationship to the sphincter is uncertain on this study. Vascular/Lymphatic: Mild prominence of retroperitoneal and mesenteric lymph nodes considered reactive in this setting. No vascular findings. Reproductive:Edematous appearance to the scrotal wall, presumably reactive. Other: No ascites or pneumoperitoneum. Musculoskeletal: No acute abnormalities. No visible spondyloarthropathy. IMPRESSION: 1.  Distal colitis correlating with history of inflammatory bowel disease. 2. 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 Electronically Signed   By: Marnee Spring M.D.   On: 09/21/2020 04:56     Assessment/Plan Principal Problem:   Acute colitis Active Problems:   Acute blood loss anemia   Nicotine dependence   Hypokalemia   Acute colitis Patient presents to the emergency room for evaluation of rectal pain associated with incontinence of stool and bright red blood. 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 We will consult gastroenterology for further recommendations Pain control    Acute blood loss anemia Patient has a hemoglobin of 9.2g/dl Prior hemoglobin was 16P/VV 4 years ago We will monitor serial H&H and will transfuse as needed    Nicotine dependence Smoking cessation was discussed with patient in detail He declines a nicotine transdermal patch at this time    Hypokalemia Supplement potassium Obtain magnesium levels  DVT prophylaxis: SCD Code Status: Full code Family Communication: Greater than 50% of time was spent discussing patient's condition and plan of care with him at the bedside.  All questions and concerns have been addressed.  He verbalizes understanding and agrees with the plan. Disposition Plan: Back to previous home environment Consults called: Gastroenterology Status: Inpatient.  The medical decision making for this patient was of high complexity and patient is at high risk for clinical deterioration during this hospitalization.    Lucile Shutters MD [facility]     09/21/2020, 8:45 AM

## 2020-09-21 NOTE — Consult Note (Signed)
SURGICAL CONSULTATION NOTE   HISTORY OF PRESENT ILLNESS (HPI):  30 y.o. male presented to [facility] ED for evaluation of rectal pain. Patient reports rectal pain since a month ago.  He reports that he has been getting worse.  He also reports some incontinence and some blood in the stool.  He denies significant abdominal pain.  Pain does not radiate to other part of the body.  There has been no alleviating or aggravating factors.  Patient report drainage to the perirectal wound.  At the ED he was found with no leukocytosis.  CT scan of the abdominal pelvis shows severe colitis.  No free air or perforation.  No sign of abscess perirectally.  Patient has family history of Crohn disease.  Surgery is consulted by Dr. Maximino Greenland in this context for evaluation and management of perianal fistula.  PAST MEDICAL HISTORY (PMH):  Past Medical History:  Diagnosis Date  . Spontaneous pneumothorax      PAST SURGICAL HISTORY (PSH):  Past Surgical History:  Procedure Laterality Date  . LUNG SURGERY       MEDICATIONS:  Prior to Admission medications   Medication Sig Start Date End Date Taking? Authorizing Provider  acetaminophen (TYLENOL) 500 MG tablet Take 1,000 mg by mouth every 6 (six) hours as needed. Do not exceed 4 grams in 24 hours. 03/15/17  Yes [provider]  ibuprofen (ADVIL) 200 MG tablet Take 200 mg by mouth every 6 (six) hours as needed.   Yes [provider]     ALLERGIES:  No Known Allergies   SOCIAL HISTORY:  Social History   Socioeconomic History  . Marital status: Single    Spouse name: Not on file  . Number of children: Not on file  . Years of education: Not on file  . Highest education level: Not on file  Occupational History  . Not on file  Tobacco Use  . Smoking status: Current Every Day Smoker    Packs/day: 1.00    Years: 5.00    Pack years: 5.00  . Smokeless tobacco: Current User  Substance and Sexual Activity  . Alcohol use: Yes     Alcohol/week: 7.0 standard drinks    Types: 5 Cans of beer, 2 Shots of liquor per week  . Drug use: Yes    Types: Marijuana  . Sexual activity: Not on file  Other Topics Concern  . Not on file  Social History Narrative  . Not on file   Social Determinants of Health   Financial Resource Strain: Not on file  Food Insecurity: Not on file  Transportation Needs: Not on file  Physical Activity: Not on file  Stress: Not on file  Social Connections: Not on file  Intimate Partner Violence: Not on file      FAMILY HISTORY:  Family History  Problem Relation Age of Onset  . Crohn's disease Mother      REVIEW OF SYSTEMS:  Constitutional: denies weight loss, fever, chills, or sweats  Eyes: denies any other vision changes, history of eye injury  ENT: denies sore throat, hearing problems  Respiratory: denies shortness of breath, wheezing  Cardiovascular: denies chest pain, palpitations  Gastrointestinal: abdominal pain, nausea and vomiting.  Positive for rectal bleeding positive for rectal pain Genitourinary: denies burning with urination or urinary frequency Musculoskeletal: denies any other joint pains or cramps  Skin: denies any other rashes or skin discolorations  Neurological: denies any other headache, dizziness, weakness  Psychiatric: denies any other depression, anxiety   All  other review of systems were negative   VITAL SIGNS:  Temp:  [97.8 F (36.6 C)-98.6 F (37 C)] 97.9 F (36.6 C) (02/18 1547) Pulse Rate:  [69-102] 75 (02/18 1547) Resp:  [15-20] 18 (02/18 1547) BP: (99-133)/(55-77) 99/55 (02/18 1547) SpO2:  [97 %-100 %] 100 % (02/18 1547) Weight:  [68 kg] 68 kg (02/17 2252)     Height: 6\' 2"  (188 cm) Weight: 68 kg BMI (Calculated): 19.25   INTAKE/OUTPUT:  This shift: No intake/output data recorded.  Last 2 shifts: @IOLAST2SHIFTS @   PHYSICAL EXAM:  Constitutional:  -- Normal body habitus  -- Awake, alert, and oriented x3  Eyes:  -- Pupils equally round and  reactive to light  -- No scleral icterus  Ear, nose, and throat:  -- No jugular venous distension  Pulmonary:  -- No crackles  -- Equal breath sounds bilaterally -- Breathing non-labored at rest Cardiovascular:  -- S1, S2 present  -- No pericardial rubs Gastrointestinal:  -- Abdomen soft, nontender, non-distended, no guarding or rebound tenderness -- No abdominal masses appreciated, pulsatile or otherwise  Musculoskeletal and Integumentary:  -- Wounds: None appreciated -- Extremities: B/L UE and LE FROM, hands and feet warm, no edema  Neurologic:  -- Motor function: intact and symmetric -- Sensation: intact and symmetric   Labs:  CBC Latest Ref Rng & Units 09/20/2020 07/24/2016  WBC 4.0 - 10.5 K/uL 7.1 13.7(H)  Hemoglobin 13.0 - 17.0 g/dL 09/22/2020) 07/26/2016  Hematocrit 39.0 - 52.0 % 29.1(L) 45.4  Platelets 150 - 400 K/uL 377 192   CMP Latest Ref Rng & Units 09/20/2020 07/24/2016  Glucose 70 - 99 mg/dL 09/22/2020) 07/26/2016)  BUN 6 - 20 mg/dL 9 15  Creatinine 765(Y - 1.24 mg/dL 650(P 5.46)  Sodium 5.68 - 145 mmol/L 130(L) 140  Potassium 3.5 - 5.1 mmol/L 3.4(L) 3.8  Chloride 98 - 111 mmol/L 98 106  CO2 22 - 32 mmol/L 24 28  Calcium 8.9 - 10.3 mg/dL 7.5(L) 9.3  Total Protein 6.5 - 8.1 g/dL 5.9(L) 7.5  Total Bilirubin 0.3 - 1.2 mg/dL 0.4 0.6  Alkaline Phos 38 - 126 U/L 113 63  AST 15 - 41 U/L 39 29  ALT 0 - 44 U/L 39 20     Imaging studies:   EXAM: CT ABDOMEN AND PELVIS WITH CONTRAST  TECHNIQUE: Multidetector CT imaging of the abdomen and pelvis was performed using the standard protocol following bolus administration of intravenous contrast.  CONTRAST:  1.27(N OMNIPAQUE IOHEXOL 300 MG/ML  SOLN  COMPARISON:  07/24/2016  FINDINGS: Lower chest:  No contributory findings.  Hepatobiliary: No focal liver abnormality.No evidence of biliary obstruction or stone.  Pancreas: Unremarkable.  Spleen: Unremarkable.  Adrenals/Urinary Tract: Negative adrenals. No hydronephrosis  or stone. Unremarkable bladder.  Stomach/Bowel: Rectosigmoid and descending colonic wall thickening with hyperenhancing mucosa and submucosal low-density edematous appearance. The associated mesentery shows prominent vessels and mild fat stranding. 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 Relationship to the sphincter is uncertain on this study.  Vascular/Lymphatic: Mild prominence of retroperitoneal and mesenteric lymph nodes considered reactive in this setting. No vascular findings.  Reproductive:Edematous appearance to the scrotal wall, presumably reactive.  Other: No ascites or pneumoperitoneum.  Musculoskeletal: No acute abnormalities. No visible spondyloarthropathy.  IMPRESSION: 1. Distal colitis correlating with history of inflammatory bowel disease. 2. 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   Electronically Signed   By: M.D.   On: 09/21/2020 04:56  Assessment/Plan:  30 y.o. 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  On the CT scan there is no finding of abscess.  Initially I would recommend to start treatment with local care and IV antibiotic therapy.  Agree with colonoscopy tomorrow for diagnostic purposes.  There is no obvious abscess that need to be drained at this moment but if the pain does not improve the only surgical option that can be offered to this patient needs a seton placement.  I will recommend to put zinc oxide cream on the irritated area on the perianal area that can help symptomatically.  Wants to work-up for inflammatory bowel disease is done we will discuss again if the patient need surgical management for the perianal fistula at this moment.  Gae Gallop,  MD

## 2020-09-21 NOTE — Progress Notes (Signed)
Patient has only ingeested small amount of prep. Encouraged him to drink it all in order to get colonoscopy in the am. [place] Spaniel he would try to drink more. He knows that he is to be NPO after midnight.

## 2020-09-21 NOTE — Plan of Care (Signed)

## 2020-09-22 ENCOUNTER — Inpatient Hospital Stay: Payer: Self-pay | Admitting: Anesthesiology

## 2020-09-22 ENCOUNTER — Encounter
Admission: EM | Disposition: A | Discharge status: Home / Self Care | Payer: Self-pay | Source: Home / Self Care | Attending: Internal Medicine

## 2020-09-22 ENCOUNTER — Encounter: Payer: Self-pay | Admitting: Internal Medicine

## 2020-09-22 HISTORY — PX: COLONOSCOPY: SHX5424

## 2020-09-22 LAB — CBC
HCT: 32.4 % — ABNORMAL LOW (ref 39.0–52.0)
Hemoglobin: 10.1 g/dL — ABNORMAL LOW (ref 13.0–17.0)
MCH: 22.7 pg — ABNORMAL LOW (ref 26.0–34.0)
MCHC: 31.2 g/dL (ref 30.0–36.0)
MCV: 73 fL — ABNORMAL LOW (ref 80.0–100.0)
Platelets: 432 10*3/uL — ABNORMAL HIGH (ref 150–400)
RBC: 4.44 MIL/uL (ref 4.22–5.81)
RDW: 15.4 % (ref 11.5–15.5)
WBC: 6.3 10*3/uL (ref 4.0–10.5)
nRBC: 0 % (ref 0.0–0.2)

## 2020-09-22 LAB — GASTROINTESTINAL PANEL BY PCR, STOOL (REPLACES STOOL CULTURE)

## 2020-09-22 LAB — BASIC METABOLIC PANEL
Anion gap: 9 (ref 5–15)
BUN: 6 mg/dL (ref 6–20)
CO2: 21 mmol/L — ABNORMAL LOW (ref 22–32)
Calcium: 7.7 mg/dL — ABNORMAL LOW (ref 8.9–10.3)
Chloride: 102 mmol/L (ref 98–111)
Creatinine, Ser: 0.61 mg/dL (ref 0.61–1.24)
GFR, Estimated: >60 mL/min (ref >60)
Glucose, Bld: 116 mg/dL — ABNORMAL HIGH (ref 70–99)
Potassium: 4.3 mmol/L (ref 3.5–5.1)
Sodium: 132 mmol/L — ABNORMAL LOW (ref 135–145)

## 2020-09-22 LAB — C DIFFICILE QUICK SCREEN W PCR REFLEX
C Diff antigen: NEGATIVE
C Diff interpretation: NOT DETECTED
C Diff toxin: NEGATIVE

## 2020-09-22 SURGERY — COLONOSCOPY
Anesthesia: General

## 2020-09-22 MED ORDER — SODIUM CHLORIDE 0.9 % IV SOLN: INTRAVENOUS | Status: DC

## 2020-09-22 MED ORDER — PROPOFOL 500 MG/50ML IV EMUL
INTRAVENOUS | Status: DC | PRN
  Administered 2020-09-22: 125 ug/kg/min via INTRAVENOUS

## 2020-09-22 MED ORDER — LIDOCAINE HCL (CARDIAC) PF 100 MG/5ML IV SOSY
PREFILLED_SYRINGE | INTRAVENOUS | Status: DC | PRN
  Administered 2020-09-22: 100 mg via INTRATRACHEAL

## 2020-09-22 MED ORDER — METRONIDAZOLE IN NACL 5-0.79 MG/ML-% IV SOLN
500.0000 mg | Freq: Three times a day (TID) | INTRAVENOUS | Status: DC
  Administered 2020-09-22 – 2020-09-25 (×9): 500 mg via INTRAVENOUS
  Filled 2020-09-22 (×11): qty 100

## 2020-09-22 MED ORDER — ZINC OXIDE 11.3 % EX CREA
TOPICAL_CREAM | Freq: Two times a day (BID) | CUTANEOUS | Status: DC
  Administered 2020-09-24: 1 via TOPICAL
  Filled 2020-09-22: qty 168
  Filled 2020-09-22: qty 113
  Filled 2020-09-22: qty 168

## 2020-09-22 MED ORDER — PROPOFOL 10 MG/ML IV BOLUS
INTRAVENOUS | Status: DC | PRN
  Administered 2020-09-22 (×8): 20 mg via INTRAVENOUS

## 2020-09-22 MED ORDER — METHYLPREDNISOLONE SODIUM SUCC 125 MG IJ SOLR
60.0000 mg | Freq: Every day | INTRAMUSCULAR | Status: DC
  Administered 2020-09-22 – 2020-09-25 (×4): 60 mg via INTRAVENOUS
  Filled 2020-09-22 (×4): qty 2

## 2020-09-22 NOTE — Interval H&P Note (Signed)
History and Physical Interval Note:  09/22/2020 11:16 AM  Albert Fuentes  has presented today for surgery, with the diagnosis of Colitis, rectal bleeding, evaluate for IBD.  The various methods of treatment have been discussed with the patient and family. After consideration of risks, benefits and other options for treatment, the patient has consented to  Procedure(s): COLONOSCOPY (N/A) as a surgical intervention.  The patient's history has been reviewed, patient examined, no change in status, stable for surgery.  I have reviewed the patient's chart and labs.  Questions were answered to the patient's satisfaction.     [place], [place]

## 2020-09-22 NOTE — Anesthesia Postprocedure Evaluation (Signed)
Anesthesia Post Note  Patient: Albert Fuentes  Procedure(s) Performed: COLONOSCOPY (N/A )  Patient location during evaluation: Endoscopy Anesthesia Type: General Level of consciousness: awake and alert Pain management: pain level controlled Vital Signs Assessment: post-procedure vital signs reviewed and stable Respiratory status: spontaneous breathing, nonlabored ventilation and respiratory function stable Cardiovascular status: blood pressure returned to baseline and stable Postop Assessment: no apparent nausea or vomiting Anesthetic complications: no   No complications documented.   Last Vitals:  Vitals:   09/22/20 1341 09/22/20 1533  BP: (!) 96/59 126/77  Pulse: 80 81  Resp: 16 16  Temp: 36.5 C 36.6 C  SpO2: 99% 100%    Last Pain:  Vitals:   09/22/20 1535  TempSrc:   PainSc: 8                  Leonard Feigel T [place]

## 2020-09-22 NOTE — Progress Notes (Signed)
PROGRESS NOTE    Albert Fuentes  VZD:[id number] DOB: 1990/09/19 DOA: 09/21/2020 PCP: Patient, No Pcp Per    Brief Narrative:  Albert Fuentes is a 30 y.o. male with medical history significant for nicotine dependence, previous opioid overdose and spontaneous pneumothorax who presents to the ER for evaluation of rectal pain and incontinence of stool and bright red blood.  Patient thought his symptoms were related to his known hemorrhoids but got concerned when he developed incontinence of stool.  He has no known history of inflammatory bowel disease but mother has a history of Crohn's disease. Patient states that he has had symptoms for about a week and that the symptoms have progressively worsened.CT scan of abdomen and pelvis shows distal colitis correlating with history of inflammatory bowel disease.  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   2/19-s/p cscope today  Consultants:   GI, Surgery  Procedures:   Antimicrobials:   Metronidazole   Subjective: Less pain. With diarrhea  Objective: Vitals:   09/22/20 1255 09/22/20 1305 09/22/20 1341 09/22/20 1533  BP: 112/72 110/69 (!) 96/59 126/77  Pulse: 86 84 80 81  Resp: (!) 9 11 16 16   Temp:   97.7 F (36.5 C) 97.9 F (36.6 C)  TempSrc:   Oral Oral  SpO2: 100% 100% 99% 100%  Weight:      Height:        Intake/Output Summary (Last 24 hours) at 09/22/2020 1547 Last data filed at 09/22/2020 1256 Gross per 24 hour  Intake 550 ml  Output --  Net 550 ml   Filed Weights   09/20/20 2252 09/22/20 1200  Weight: 68 kg 68 kg    Examination:  General exam: Appears calm and comfortable  Respiratory system: Clear to auscultation. Respiratory effort normal. Cardiovascular system: S1 & S2 heard, RRR. No JVD, murmurs, rubs, gallops or clicks.  Gastrointestinal system: Abdomen is nondistended, soft and nontender. Normal bowel sounds heard. Central nervous system: Alert and oriented. No focal neurological deficits. Extremities:  No edema Psychiatry: Judgement and insight appear normal. Mood & affect appropriate.     Data Reviewed: I have personally reviewed following labs and imaging studies  CBC: Recent Labs  Lab 09/20/20 2255 09/22/20 0504  WBC 7.1 6.3  HGB 9.2* 10.1*  HCT 29.1* 32.4*  MCV 73.1* 73.0*  PLT 377 432*   Basic Metabolic Panel: Recent Labs  Lab 09/20/20 2255 09/22/20 0504  NA 130* 132*  K 3.4* 4.3  CL 98 102  CO2 24 21*  GLUCOSE 152* 116*  BUN 9 6  CREATININE 0.91 0.61  CALCIUM 7.5* 7.7*   GFR: Estimated Creatinine Clearance: 132.2 mL/min (by C-G formula based on SCr of 0.61 mg/dL). Liver Function Tests: Recent Labs  Lab 09/20/20 2255  AST 39  ALT 39  ALKPHOS 113  BILITOT 0.4  PROT 5.9*  ALBUMIN 1.7*   No results for input(s): LIPASE, AMYLASE in the last 168 hours. No results for input(s): AMMONIA in the last 168 hours. Coagulation Profile: Recent Labs  Lab 09/21/20 0255  INR 1.3*   Cardiac Enzymes: No results for input(s): CKTOTAL, CKMB, CKMBINDEX, TROPONINI in the last 168 hours. BNP (last 3 results) No results for input(s): PROBNP in the last 8760 hours. HbA1C: No results for input(s): HGBA1C in the last 72 hours. CBG: No results for input(s): GLUCAP in the last 168 hours. Lipid Profile: No results for input(s): CHOL, HDL, LDLCALC, TRIG, CHOLHDL, LDLDIRECT in the last 72 hours. Thyroid Function Tests: No results for input(s):  TSH, T4TOTAL, FREET4, T3FREE, THYROIDAB in the last 72 hours. Anemia Panel: No results for input(s): VITAMINB12, FOLATE, FERRITIN, TIBC, IRON, RETICCTPCT in the last 72 hours. Sepsis Labs: Recent Labs  Lab 09/21/20 0610  LATICACIDVEN 0.8    Recent Results (from the past 240 hour(s))  Resp Panel by RT-PCR (Flu A&B, Covid) Nasopharyngeal Swab     Status: None   Collection Time: 09/21/20  5:03 AM   Specimen: Nasopharyngeal Swab; Nasopharyngeal(NP) swabs in vial transport medium  Result Value Ref Range Status   SARS Coronavirus  2 by RT PCR NEGATIVE NEGATIVE Final    Comment: (NOTE) SARS-CoV-2 target nucleic acids are NOT DETECTED.  The SARS-CoV-2 RNA is generally detectable in upper respiratory specimens during the acute phase of infection. The lowest concentration of SARS-CoV-2 viral copies this assay can detect is 138 copies/mL. A negative result does not preclude SARS-Cov-2 infection and should not be used as the sole basis for treatment or other patient management decisions. A negative result may occur with  improper specimen collection/handling, submission of specimen other than nasopharyngeal swab, presence of viral mutation(s) within the areas targeted by this assay, and inadequate number of viral copies(<138 copies/mL). A negative result must be combined with clinical observations, patient history, and epidemiological information. The expected result is Negative.  Fact Sheet for Patients:  BloggerCourse.com  Fact Sheet for Healthcare Providers:  SeriousBroker.it  This test is no t yet approved or cleared by the [place] FDA and  has been authorized for detection and/or diagnosis of SARS-CoV-2 by FDA under an Emergency Use Authorization (EUA). This EUA will remain  in effect (meaning this test can be used) for the duration of the COVID-19 declaration under Section 564(b)(1) of the Act, 21 U.S.C.section 360bbb-3(b)(1), unless the authorization is terminated  or revoked sooner.       Influenza A by PCR NEGATIVE NEGATIVE Final   Influenza B by PCR NEGATIVE NEGATIVE Final    Comment: (NOTE) The Xpert Xpress SARS-CoV-2/FLU/RSV plus assay is intended as an aid in the diagnosis of influenza from Nasopharyngeal swab specimens and should not be used as a sole basis for treatment. Nasal washings and aspirates are unacceptable for Xpert Xpress SARS-CoV-2/FLU/RSV testing.  Fact Sheet for Patients: BloggerCourse.com  Fact  Sheet for Healthcare Providers: SeriousBroker.it  This test is not yet approved or cleared by the [place] FDA and has been authorized for detection and/or diagnosis of SARS-CoV-2 by FDA under an Emergency Use Authorization (EUA). This EUA will remain in effect (meaning this test can be used) for the duration of the COVID-19 declaration under Section 564(b)(1) of the Act, 21 U.S.C. section 360bbb-3(b)(1), unless the authorization is terminated or revoked.  Performed at [facility], [address] [place], [place] [address]   C Difficile Quick Screen w PCR reflex     Status: None   Collection Time: 09/22/20  2:12 PM   Specimen: STOOL  Result Value Ref Range Status   C Diff antigen NEGATIVE NEGATIVE Final   C Diff toxin NEGATIVE NEGATIVE Final   C Diff interpretation No C. difficile detected.  Final    Comment: Performed at [facility], [address], [place], [place] [address]         Radiology Studies: CT ABDOMEN PELVIS W CONTRAST  Result Date: 09/21/2020 CLINICAL DATA:  Inflammatory bowel disease. Hemorrhoids. Anal fistula EXAM: CT ABDOMEN AND PELVIS WITH CONTRAST TECHNIQUE: Multidetector CT imaging of the abdomen and pelvis was performed using the standard protocol following  bolus administration of intravenous contrast. CONTRAST:  OMNIPAQUE IOHEXOL 300 MG/ML  SOLN COMPARISON:  07/24/2016 FINDINGS: Lower chest:  No contributory findings. Hepatobiliary: No focal liver abnormality.No evidence of biliary obstruction or stone. Pancreas: Unremarkable. Spleen: Unremarkable. Adrenals/Urinary Tract: Negative adrenals. No hydronephrosis or stone. Unremarkable bladder. Stomach/Bowel: Rectosigmoid and descending colonic wall thickening with hyperenhancing mucosa and submucosal low-density edematous appearance. The associated mesentery shows prominent vessels and mild fat stranding. There is a left perianal fistula at the 3 o'clock  position which contains some fluid and gas. Relationship to the sphincter is uncertain on this study. Vascular/Lymphatic: Mild prominence of retroperitoneal and mesenteric lymph nodes considered reactive in this setting. No vascular findings. Reproductive:Edematous appearance to the scrotal wall, presumably reactive. Other: No ascites or pneumoperitoneum. Musculoskeletal: No acute abnormalities. No visible spondyloarthropathy. IMPRESSION: 1. Distal colitis correlating with history of inflammatory bowel disease. 2. 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 Electronically Signed   By: Marnee Spring M.D.   On: 09/21/2020 04:56        Scheduled Meds: . methylPREDNISolone (SOLU-MEDROL) injection  60 mg Intravenous Daily  . zinc oxide   Topical BID   Continuous Infusions: . 0.9 % NaCl with KCl 40 mEq / L 100 mL/hr at 09/22/20 1310  . metronidazole 500 mg (09/22/20 1536)    Assessment & Plan:   Principal Problem:   Acute colitis Active Problems:   Acute blood loss anemia   Nicotine dependence   Hypokalemia   Acute colitis Patient presents to the emergency room for evaluation of rectal pain associated with incontinence of stool and bright red blood. 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 2/19 surgery and GI consulted Status post colonoscopy a- perianal fistula found on perineal exam Erythematous mucosa in the ascending colon Congested mucosa in the rectum, in the sigmoid colon, and descending colon Biopsies performed Check routine bacterial stool cultures Fecal calprotectin, CRP Clear liquid diet for 2 days then advance as tolerated to mechanical soft diet check stool for C. difficile toxin   Acute blood loss anemia Prior hemoglobin was 15g/dl 4 years ago 5/88-FO 27.7, stable Continue to monitor    Nicotine dependence Smoking cessation was discussed during admission and he  declined nicotine patch    Hypokalemia Supplemented, K stable at 4.3 today   DVT prophylaxis: scd Code Status: Full Family Communication: None at bedside  Status is: Inpatient  Remains inpatient appropriate because:Inpatient level of care appropriate due to severity of illness   Dispo: The patient is from: Home              Anticipated d/c is to: Home              Anticipated d/c date is: 2 days              Patient currently is not medically stable to d/c.   Difficult to place patient No            LOS: 1 day   Time spent: 35 minutes with more than 50% on COC    Lynn Ito, MD [facility] Pager [phone]  If 7PM-7AM, please contact night-coverage 09/22/2020, 3:47 PM

## 2020-09-22 NOTE — Transfer of Care (Signed)
Immediate Anesthesia Transfer of Care Note  Patient: Albert Fuentes  Procedure(s) Performed: COLONOSCOPY (N/A )  Patient Location: PACU  Anesthesia Type:MAC  Level of Consciousness: awake  Airway & Oxygen Therapy: Patient Spontanous Breathing  Post-op Assessment: Report given to RN  Post vital signs: stable  Last Vitals:  Vitals Value Taken Time  BP 112/72 09/22/20 1255  Temp    Pulse 86 09/22/20 1255  Resp 9 09/22/20 1255  SpO2 100 % 09/22/20 1255    Last Pain:  Vitals:   09/22/20 1255  TempSrc:   PainSc: Asleep         Complications: No complications documented.

## 2020-09-22 NOTE — Anesthesia Preprocedure Evaluation (Signed)
Anesthesia Evaluation  Patient identified by MRN, date of birth, ID band Patient awake    Reviewed: Allergy & Precautions, H&P , NPO status , reviewed documented beta blocker date and time   Airway Mallampati: II  TM Distance: >3 FB Neck ROM: full    Dental  (+) Teeth Intact   Pulmonary Current Smoker and Patient abstained from smoking.,    Pulmonary exam normal        Cardiovascular Normal cardiovascular exam     Neuro/Psych    GI/Hepatic neg GERD  ,  Endo/Other    Renal/GU      Musculoskeletal   Abdominal   Peds  Hematology  (+) Blood dyscrasia, anemia ,   Anesthesia Other Findings Past Medical History: No date: Spontaneous pneumothorax  Past Surgical History: No date: LUNG SURGERY  BMI    Body Mass Index: 19.25 kg/m      Reproductive/Obstetrics                             Anesthesia Physical Anesthesia Plan  ASA: II  Anesthesia Plan: General   Post-op Pain Management:    Induction: Intravenous  PONV Risk Score and Plan: Treatment may vary due to age or medical condition and TIVA  Airway Management Planned: Nasal Cannula and Natural Airway  Additional Equipment:   Intra-op Plan:   Post-operative Plan:   Informed Consent: I have reviewed the patients History and Physical, chart, labs and discussed the procedure including the risks, benefits and alternatives for the proposed anesthesia with the patient or authorized representative who has indicated his/her understanding and acceptance.     Dental Advisory Given  Plan Discussed with: CRNA  Anesthesia Plan Comments:         Anesthesia Quick Evaluation

## 2020-09-22 NOTE — Op Note (Signed)
[facility] Gastroenterology Patient Name: Albert Fuentes Procedure Date: 09/22/2020 11:55 AM MRN: [id number] Account #: [id number] [birth year], [id number] Date of Birth: 1990-09-23 Admit Type: Inpatient Age: 30 [facility]OM 4 Gender: Male Note Status: Finalized Procedure:             Colonoscopy Indications:           Diarrhea (presumed secondary to Crohn's disease),                         Hematochezia, Anorectal fistula, Incidental abdominal                         pain noted Providers:             Boykin Nearingeodoro K. Norma Fredricksonoledo MD, MD Referring MD:          no local MD Medicines:             Propofol per Anesthesia Complications:         No immediate complications. Estimated blood loss:                         Minimal. Procedure:             Pre-Anesthesia Assessment:                        - The risks and benefits of the procedure and the                         sedation options and risks were discussed with the                         patient. All questions were answered and informed                         consent was obtained.                        - Patient identification and proposed procedure were                         verified prior to the procedure by the nurse. The                         procedure was verified in the procedure room.                        - ASA Grade Assessment: III - A patient with severe                         systemic disease.                        - After reviewing the risks and benefits, the patient                         was deemed in satisfactory condition to undergo the                         procedure.  After obtaining informed consent, the colonoscope was                         passed under direct vision. Throughout the procedure,                         the patient's blood pressure, pulse, and oxygen                         saturations were monitored continuously. The                         Colonoscope was introduced  through the anus and                         advanced to the the terminal ileum, with                         identification of the appendiceal orifice and IC                         valve. The colonoscopy was performed without                         difficulty. The patient tolerated the procedure well.                         The quality of the bowel preparation was adequate. The                         terminal ileum, ileocecal valve, appendiceal orifice,                         and rectum were photographed. Findings:      The perianal exam findings include perianal fistula.      The digital rectal exam was normal. Pertinent negatives include normal       sphincter tone.      The terminal ileum appeared normal. Biopsies were taken with a cold       forceps for histology.      A segmental area of mildly erythematous mucosa was found in the       ascending colon. Estimated blood loss was minimal.      Normal mucosa was found in the transverse colon. Biopsies were taken       with a cold forceps for histology.      An area of significantly congested mucosa was found in the rectum, in       the sigmoid colon and in the descending colon. Ulcerations and loss of       vascular markings also present with contact bleeding. Two biopsies were       obtained with cold forceps for evaluation of inflammatory bowel disease       in the descending colon, as well as four biopsies in the sigmoid colon       and two biopsies in the rectum.      Retroflexion in the rectum was not performed due to low rectal       compliance and severe edema.      The exam was otherwise without abnormality. 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                        - The examined portion of the ileum was normal.                         Biopsied.                        - Erythematous mucosa in the ascending colon.                        - Normal mucosa in the transverse colon. Biopsied.                         - Congested mucosa in the rectum, in the sigmoid colon                         and in the descending colon.                        - The examination was otherwise normal.                        - Biopsies performed in the descending colon, in the                         sigmoid colon and in the rectum. Recommendation:        - Return patient to hospital ward for ongoing care.                        - Check stool for Clostridium difficile toxin.                        - Check routine bacterial stool cultures.                        - fecal calprotectin, CRP                        - Clear liquid diet for 2 days, then advance as                         tolerated to mechanical soft diet.                        - GI service to continue following along with                         inpatient team.                        - The findings and recommendations were discussed with                         the patient. Procedure Code(s):     --- Professional ---                        [phone], Colonoscopy, flexible; with biopsy, single or  multiple Diagnosis Code(s):     --- Professional ---                        K60.5, Anorectal fistula                        K92.1, Melena (includes Hematochezia)                        R19.7, Diarrhea, unspecified                        K63.89, Other specified diseases of intestine                        K62.89, Other specified diseases of anus and rectum                        K60.3, Anal fistula CPT copyright 2019 American Medical Association. All rights reserved. The codes documented in this report are preliminary and upon coder review may  be revised to meet current compliance requirements. Stanton Kidney MD, MD 09/22/2020 1:11:58 PM This report has been signed electronically. Number of Addenda: 0 Note Initiated On: 09/22/2020 11:55 AM Scope Withdrawal Time: 0 hours 11 minutes 12 seconds  Total Procedure Duration: 0  hours 16 minutes 40 seconds  Estimated Blood Loss:  Estimated blood loss was minimal. Estimated blood loss                         was minimal.      [employer]

## 2020-09-22 NOTE — Progress Notes (Signed)
SURGICAL PROGRESS NOTE   Hospital Day(s): 1.   Post op day(s): Day of Surgery.   Interval History: Patient seen and examined, no acute events or new complaints overnight. Patient reports feeling a little bit better but still with significant discomfrot on the perianal area. Pain does not radiates. Pain aggravated by bowel movement. No alleviating factors.   Vital signs in last 24 hours: [min-max] current  Temp:  [97.2 F (36.2 C)-98.7 F (37.1 C)] 97.2 F (36.2 C) (02/19 1200) Pulse Rate:  [75-89] 85 (02/19 1200) Resp:  [14-18] 14 (02/19 1200) BP: (99-112)/(55-74) 108/68 (02/19 1200) SpO2:  [96 %-100 %] 100 % (02/19 1200) Weight:  [68 kg] 68 kg (02/19 1200)     Height: 6\' 2"  (188 cm) Weight: 68 kg BMI (Calculated): 19.24   Physical Exam:  Constitutional: alert, cooperative and no distress  Respiratory: breathing non-labored at rest  Cardiovascular: regular rate and sinus rhythm  Gastrointestinal: soft, non-tender, and non-distended Rectal: tight sphincter tone. circumferential erythema with fistula opening at 3 o'clock.   Labs:  CBC Latest Ref Rng & Units 09/22/2020 09/20/2020 07/24/2016  WBC 4.0 - 10.5 K/uL 6.3 7.1 13.7(H)  Hemoglobin 13.0 - 17.0 g/dL 10.1(L) 9.2(L) 15.3  Hematocrit 39.0 - 52.0 % 32.4(L) 29.1(L) 45.4  Platelets 150 - 400 K/uL 432(H) 377 192   CMP Latest Ref Rng & Units 09/22/2020 09/20/2020 07/24/2016  Glucose 70 - 99 mg/dL 07/26/2016) 017(P) 102(H)  BUN 6 - 20 mg/dL 6 9 15   Creatinine 0.61 - 1.24 mg/dL 852(D 7.82)  Sodium 135 - 145 mmol/L 132(L) 130(L) 140  Potassium 3.5 - 5.1 mmol/L 4.3 3.4(L) 3.8  Chloride 98 - 111 mmol/L 102 98 106  CO2 22 - 32 mmol/L 21(L) 24 28  Calcium 8.9 - 10.3 mg/dL 7.7(L) 7.5(L) 9.3  Total Protein 6.5 - 8.1 g/dL - 5.9(L) 7.5  Total Bilirubin 0.3 - 1.2 mg/dL - 0.4 0.6  Alkaline Phos 38 - 126 U/L - 113 63  AST 15 - 41 U/L - 39 29  ALT 0 - 44 U/L - 39 20    Imaging studies: No new pertinent imaging  studies   Assessment/Plan:  30 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  Patient with minimal improvement since yesterday. I discussed case with GI who will perform colonoscopy today. Patient may need to start treatment for acute flare up of  IBD (abx therapy and steroids) if colonoscopy is suggestive. This might improve the perianal symptoms as well. No abscess at this moment. Will continue to follow clinically to see if patient needs seton placement.     5.36(R, MD

## 2020-09-23 LAB — C-REACTIVE PROTEIN: CRP: 6.2 mg/dL — ABNORMAL HIGH (ref <1.0)

## 2020-09-23 LAB — HIV ANTIBODY (ROUTINE TESTING W REFLEX): HIV Screen 4th Generation wRfx: NONREACTIVE

## 2020-09-23 MED ORDER — PRAMOXINE-HC 1-1 % EX FOAM
1.0000 | Freq: Two times a day (BID) | CUTANEOUS | Status: DC
  Administered 2020-09-23 – 2020-09-25 (×5): 1 via TOPICAL
  Filled 2020-09-23 (×3): qty 10

## 2020-09-23 NOTE — Progress Notes (Signed)
While doing bedside shift report, patients room and bathroom smelled of cigarette smoke. Patient advised that there is no smoking on hospital property. Patient verbalized understanding.

## 2020-09-23 NOTE — Progress Notes (Signed)
SURGICAL PROGRESS NOTE   Hospital Day(s): 2.   Post op day(s): 1 Day Post-Op.   Interval History: Patient seen and examined, no acute events or new complaints overnight. Patient reports having pain when trying to go to the bathroom to have a bowel movement.  Pain is suprapubic and rectally.  Pain does not radiate to the part of the body.  Pain aggravated by bowel movement.  There has been no alleviating factors.  Patient had colonoscopy yesterday.  This shows severe edema and erythema of large intestine mucosa.  Biopsies taken.  I personally evaluated the images of the colonoscopy.  Vital signs in last 24 hours: [min-max] current  Temp:  [97.2 F (36.2 C)-98.7 F (37.1 C)] 97.8 F (36.6 C) (02/20 0811) Pulse Rate:  [79-86] 80 (02/20 0811) Resp:  [9-18] 18 (02/20 0811) BP: (96-126)/(59-85) 121/78 (02/20 0811) SpO2:  [99 %-100 %] 100 % (02/20 0811) Weight:  [68 kg] 68 kg (02/19 1200)     Height: 6\' 2"  (188 cm) Weight: 68 kg BMI (Calculated): 19.24   Physical Exam:  Constitutional: alert, cooperative and no distress  Respiratory: breathing non-labored at rest  Cardiovascular: regular rate and sinus rhythm  Gastrointestinal: soft, mild-tender, and non-distended.  Rectal exam shows erythema on the perianal area.  Mild tenderness palpation.  Labs:  CBC Latest Ref Rng & Units 09/22/2020 09/20/2020 07/24/2016  WBC 4.0 - 10.5 K/uL 6.3 7.1 13.7(H)  Hemoglobin 13.0 - 17.0 g/dL 10.1(L) 9.2(L) 15.3  Hematocrit 39.0 - 52.0 % 32.4(L) 29.1(L) 45.4  Platelets 150 - 400 K/uL 432(H) 377 192   CMP Latest Ref Rng & Units 09/22/2020 09/20/2020 07/24/2016  Glucose 70 - 99 mg/dL 07/26/2016) 397(Q) 734(L)  BUN 6 - 20 mg/dL 6 9 15   Creatinine 0.61 - 1.24 mg/dL 937(T 0.24)  Sodium 135 - 145 mmol/L 132(L) 130(L) 140  Potassium 3.5 - 5.1 mmol/L 4.3 3.4(L) 3.8  Chloride 98 - 111 mmol/L 102 98 106  CO2 22 - 32 mmol/L 21(L) 24 28  Calcium 8.9 - 10.3 mg/dL 7.7(L) 7.5(L) 9.3  Total Protein 6.5 - 8.1 g/dL -  5.9(L) 7.5  Total Bilirubin 0.3 - 1.2 mg/dL - 0.4 0.6  Alkaline Phos 38 - 126 U/L - 113 63  AST 15 - 41 U/L - 39 29  ALT 0 - 44 U/L - 39 20    Imaging studies: No new pertinent imaging studies   Assessment/Plan:  30 y.o. male with perianal fistula and acute colitis, complicated by pertinent comorbidities including highly suspicious IBD.  Patient was started on steroid therapy and antibiotic therapy.  I put orders to apply zinc oxide to the perianal area.  I discussed with the patient that if he does not improve symptomatically from the physical standpoint we might consider to put a seton.  We discussed about trying conservative management failed with the antibiotic therapy and local care.  We will continue to follow.  3.53(G, MD

## 2020-09-23 NOTE — Progress Notes (Signed)
PROGRESS NOTE    Albert Fuentes  NWG:[id number] DOB: 1991-07-03 DOA: 09/21/2020 PCP: Patient, No Pcp Per    Brief Narrative:  Albert Fuentes is a 30 y.o. male with medical history significant for nicotine dependence, previous opioid overdose and spontaneous pneumothorax who presents to the ER for evaluation of rectal pain and incontinence of stool and bright red blood.  Patient thought his symptoms were related to his known hemorrhoids but got concerned when he developed incontinence of stool.  He has no known history of inflammatory bowel disease but mother has a history of Crohn's disease. Patient states that he has had symptoms for about a week and that the symptoms have progressively worsened.CT scan of abdomen and pelvis shows distal colitis correlating with history of inflammatory bowel disease.  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   2/19-s/p cscope today 2/20-found to be smoking in bathroom by nsg and was told not to smoke again.  C/o pain and wants something stronger than morphine or change interval to be shorter time to give pain med   Consultants:   GI, Surgery  Procedures:   Antimicrobials:   Metronidazole   Subjective: Less pain. With diarrhea  Objective: Vitals:   09/22/20 1955 09/22/20 2347 09/23/20 0430 09/23/20 0811  BP: 119/85 111/82 116/79 121/78  Pulse: 80 79 83 80  Resp: 18 18 16 18   Temp: 97.9 F (36.6 C) 98 F (36.7 C) 98.7 F (37.1 C) 97.8 F (36.6 C)  TempSrc: Oral     SpO2: 100% 99% 100% 100%  Weight:      Height:        Intake/Output Summary (Last 24 hours) at 09/23/2020 0859 Last data filed at 09/23/2020 0720 Gross per 24 hour  Intake 5096.55 ml  Output -  Net 5096.55 ml   Filed Weights   09/20/20 2252 09/22/20 1200  Weight: 68 kg 68 kg    Examination:  Nad, calm cta no wheeze rales rhonchi's Regular S1-S2 no gallop Soft positive bowel sounds nondistended mildly tender to palpation No edema Alert oriented x3 grossly  intact  Data Reviewed: I have personally reviewed following labs and imaging studies  CBC: Recent Labs  Lab 09/20/20 2255 09/22/20 0504  WBC 7.1 6.3  HGB 9.2* 10.1*  HCT 29.1* 32.4*  MCV 73.1* 73.0*  PLT 377 432*   Basic Metabolic Panel: Recent Labs  Lab 09/20/20 2255 09/22/20 0504  NA 130* 132*  K 3.4* 4.3  CL 98 102  CO2 24 21*  GLUCOSE 152* 116*  BUN 9 6  CREATININE 0.91 0.61  CALCIUM 7.5* 7.7*   GFR: Estimated Creatinine Clearance: 132.2 mL/min (by C-G formula based on SCr of 0.61 mg/dL). Liver Function Tests: Recent Labs  Lab 09/20/20 2255  AST 39  ALT 39  ALKPHOS 113  BILITOT 0.4  PROT 5.9*  ALBUMIN 1.7*   No results for input(s): LIPASE, AMYLASE in the last 168 hours. No results for input(s): AMMONIA in the last 168 hours. Coagulation Profile: Recent Labs  Lab 09/21/20 0255  INR 1.3*   Cardiac Enzymes: No results for input(s): CKTOTAL, CKMB, CKMBINDEX, TROPONINI in the last 168 hours. BNP (last 3 results) No results for input(s): PROBNP in the last 8760 hours. HbA1C: No results for input(s): HGBA1C in the last 72 hours. CBG: No results for input(s): GLUCAP in the last 168 hours. Lipid Profile: No results for input(s): CHOL, HDL, LDLCALC, TRIG, CHOLHDL, LDLDIRECT in the last 72 hours. Thyroid Function Tests: No results for input(s): TSH,  T4TOTAL, FREET4, T3FREE, THYROIDAB in the last 72 hours. Anemia Panel: No results for input(s): VITAMINB12, FOLATE, FERRITIN, TIBC, IRON, RETICCTPCT in the last 72 hours. Sepsis Labs: Recent Labs  Lab 09/21/20 0610  LATICACIDVEN 0.8    Recent Results (from the past 240 hour(s))  Resp Panel by RT-PCR (Flu A&B, Covid) Nasopharyngeal Swab     Status: None   Collection Time: 09/21/20  5:03 AM   Specimen: Nasopharyngeal Swab; Nasopharyngeal(NP) swabs in vial transport medium  Result Value Ref Range Status   SARS Coronavirus 2 by RT PCR NEGATIVE NEGATIVE Final    Comment: (NOTE) SARS-CoV-2 target nucleic  acids are NOT DETECTED.  The SARS-CoV-2 RNA is generally detectable in upper respiratory specimens during the acute phase of infection. The lowest concentration of SARS-CoV-2 viral copies this assay can detect is 138 copies/mL. A negative result does not preclude SARS-Cov-2 infection and should not be used as the sole basis for treatment or other patient management decisions. A negative result may occur with  improper specimen collection/handling, submission of specimen other than nasopharyngeal swab, presence of viral mutation(s) within the areas targeted by this assay, and inadequate number of viral copies(<138 copies/mL). A negative result must be combined with clinical observations, patient history, and epidemiological information. The expected result is Negative.  Fact Sheet for Patients:  BloggerCourse.com  Fact Sheet for Healthcare Providers:  SeriousBroker.it  This test is no t yet approved or cleared by the [place] FDA and  has been authorized for detection and/or diagnosis of SARS-CoV-2 by FDA under an Emergency Use Authorization (EUA). This EUA will remain  in effect (meaning this test can be used) for the duration of the COVID-19 declaration under Section 564(b)(1) of the Act, 21 U.S.C.section 360bbb-3(b)(1), unless the authorization is terminated  or revoked sooner.       Influenza A by PCR NEGATIVE NEGATIVE Final   Influenza B by PCR NEGATIVE NEGATIVE Final    Comment: (NOTE) The Xpert Xpress SARS-CoV-2/FLU/RSV plus assay is intended as an aid in the diagnosis of influenza from Nasopharyngeal swab specimens and should not be used as a sole basis for treatment. Nasal washings and aspirates are unacceptable for Xpert Xpress SARS-CoV-2/FLU/RSV testing.  Fact Sheet for Patients: BloggerCourse.com  Fact Sheet for Healthcare Providers: SeriousBroker.it  This  test is not yet approved or cleared by the [place] FDA and has been authorized for detection and/or diagnosis of SARS-CoV-2 by FDA under an Emergency Use Authorization (EUA). This EUA will remain in effect (meaning this test can be used) for the duration of the COVID-19 declaration under Section 564(b)(1) of the Act, 21 U.S.C. section 360bbb-3(b)(1), unless the authorization is terminated or revoked.  Performed at [facility], [address] [place], [place] [address]   C Difficile Quick Screen w PCR reflex     Status: None   Collection Time: 09/22/20  2:12 PM   Specimen: STOOL  Result Value Ref Range Status   C Diff antigen NEGATIVE NEGATIVE Final   C Diff toxin NEGATIVE NEGATIVE Final   C Diff interpretation No C. difficile detected.  Final    Comment: Performed at [facility], [address] [place], [place] [address]  Gastrointestinal Panel by PCR , Stool     Status: None   Collection Time: 09/22/20  2:12 PM   Specimen: STOOL  Result Value Ref Range Status   Campylobacter species NOT DETECTED NOT DETECTED Final   Plesimonas shigelloides NOT DETECTED NOT DETECTED Final   Salmonella  species NOT DETECTED NOT DETECTED Final   Yersinia enterocolitica NOT DETECTED NOT DETECTED Final   Vibrio species NOT DETECTED NOT DETECTED Final   Vibrio cholerae NOT DETECTED NOT DETECTED Final   Enteroaggregative E coli (EAEC) NOT DETECTED NOT DETECTED Final   Enteropathogenic E coli (EPEC) NOT DETECTED NOT DETECTED Final   Enterotoxigenic E coli (ETEC) NOT DETECTED NOT DETECTED Final   Shiga like toxin producing E coli (STEC) NOT DETECTED NOT DETECTED Final   Shigella/Enteroinvasive E coli (EIEC) NOT DETECTED NOT DETECTED Final   Cryptosporidium NOT DETECTED NOT DETECTED Final   Cyclospora cayetanensis NOT DETECTED NOT DETECTED Final   Entamoeba histolytica NOT DETECTED NOT DETECTED Final   Giardia lamblia NOT DETECTED NOT DETECTED Final   Adenovirus F40/41 NOT  DETECTED NOT DETECTED Final   Astrovirus NOT DETECTED NOT DETECTED Final   Norovirus GI/GII NOT DETECTED NOT DETECTED Final   Rotavirus A NOT DETECTED NOT DETECTED Final   Sapovirus (I, II, IV, and V) NOT DETECTED NOT DETECTED Final    Comment: Performed at [facility], [address] [place], [place] [address]         Radiology Studies: No results found.      Scheduled Meds: . methylPREDNISolone (SOLU-MEDROL) injection  60 mg Intravenous Daily  . zinc oxide   Topical BID   Continuous Infusions: . 0.9 % NaCl with KCl 40 mEq / L 100 mL/hr at 09/22/20 2236  . metronidazole 500 mg (09/23/20 0532)    Assessment & Plan:   Principal Problem:   Acute colitis Active Problems:   Acute blood loss anemia   Nicotine dependence   Hypokalemia   Acute colitis/perianal fistula Patient presents to the emergency room for evaluation of rectal pain associated with incontinence of stool and bright red blood. 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 difficile and GI panel negative Protectant and CRP pending Clear liquid diet for 1 more day then advance as tolerated to mechanical soft diet  Continue IV steroids and metronidazole Apply zinc oxide to perianal area. Per surgery if he does not improve symptomatically from the physical standpoint, may consider to put a seton. First try with conservative management failed with antibiotic therapy and local care. GI added Proctofoam  Acute blood loss anemia Prior hemoglobin was 15g/dl 4 years ago 8/24-MP labs today. Will ck in am.     Nicotine dependence Patient was counseled about smoking.   He declined nicotine patch.   2/20-Was told by nursing today patient was smoking in the bathroom and has been told not to do this again.      Hypokalemia Supplemented, stable.  Monitor levels   DVT prophylaxis: scd Code Status: Full Family Communication: None at bedside  Status is: Inpatient  Remains inpatient appropriate because:Inpatient level of care appropriate due to severity of illness   Dispo: The patient is from: Home              Anticipated d/c is to: Home              Anticipated d/c date is: 2 days              Patient currently is not medically stable to d/c.  Difficult to place patient No            LOS: 2 days   Time spent: 35 minutes with more than 50% on COC    Lynn Ito, MD [facility] Pager [phone]  If 7PM-7AM, please contact night-coverage 09/23/2020, 8:59 AM

## 2020-09-23 NOTE — Progress Notes (Signed)
[facility] Inpatient Progress Note  Subjective: Patient seen for f/u presumed Crohn's disease of the Large intestine. Biopsy results pending from colonoscopy yesterday. Patient feels "somewhat better" but still has significant painful BM's and severe tenesmus. Had smoked a cigarette in the bathroom and was warned by staff to cease. He acknowledged and agreed.  Objective: Vital signs in last 24 hours: Temp:  [97.8 F (36.6 C)-98.7 F (37.1 C)] 97.8 F (36.6 C) (02/20 0811) Pulse Rate:  [79-85] 85 (02/20 1150) Resp:  [16-18] 18 (02/20 1150) BP: (111-126)/(77-85) 126/82 (02/20 1150) SpO2:  [99 %-100 %] 100 % (02/20 1150) Blood pressure 126/82, pulse 85, temperature 97.8 F (36.6 C), resp. rate 18, height 6\' 2"  (1.88 m), weight 68 kg, SpO2 100 %.    Intake/Output from previous day: 02/19 0701 - 02/20 0700 In: 3389.5 [I.V.:3361.7; IV Piggyback:27.8] Out: -   Intake/Output this shift: Total I/O In: 1827.1 [P.O.:120; I.V.:1434.9; IV Piggyback:272.2] Out: -    General appearance:Ill-appearing male, NAD Resp:  CTA Cardio: RRR GI: flat, diffusely tender, mild guarding, no rebound. BS positive. Extremities: Thin, no rashes. Muscle loss globally.   Lab Results: Results for orders placed or performed during the hospital encounter of 09/21/20 (from the past 24 hour(s))  C Difficile Quick Screen w PCR reflex     Status: None   Collection Time: 09/22/20  2:12 PM   Specimen: STOOL  Result Value Ref Range   C Diff antigen NEGATIVE NEGATIVE   C Diff toxin NEGATIVE NEGATIVE   C Diff interpretation No C. difficile detected.   Gastrointestinal Panel by PCR , Stool     Status: None   Collection Time: 09/22/20  2:12 PM   Specimen: STOOL  Result Value Ref Range   Campylobacter species NOT DETECTED NOT DETECTED   Plesimonas shigelloides NOT DETECTED NOT DETECTED   Salmonella species NOT DETECTED NOT DETECTED   Yersinia enterocolitica NOT DETECTED NOT DETECTED    Vibrio species NOT DETECTED NOT DETECTED   Vibrio cholerae NOT DETECTED NOT DETECTED   Enteroaggregative E coli (EAEC) NOT DETECTED NOT DETECTED   Enteropathogenic E coli (EPEC) NOT DETECTED NOT DETECTED   Enterotoxigenic E coli (ETEC) NOT DETECTED NOT DETECTED   Shiga like toxin producing E coli (STEC) NOT DETECTED NOT DETECTED   Shigella/Enteroinvasive E coli (EIEC) NOT DETECTED NOT DETECTED   Cryptosporidium NOT DETECTED NOT DETECTED   Cyclospora cayetanensis NOT DETECTED NOT DETECTED   Entamoeba histolytica NOT DETECTED NOT DETECTED   Giardia lamblia NOT DETECTED NOT DETECTED   Adenovirus F40/41 NOT DETECTED NOT DETECTED   Astrovirus NOT DETECTED NOT DETECTED   Norovirus GI/GII NOT DETECTED NOT DETECTED   Rotavirus A NOT DETECTED NOT DETECTED   Sapovirus (I, II, IV, and V) NOT DETECTED NOT DETECTED     Recent Labs    09/20/20 2255 09/22/20 0504  WBC 7.1 6.3  HGB 9.2* 10.1*  HCT 29.1* 32.4*  PLT 377 432*   BMET Recent Labs    09/20/20 2255 09/22/20 0504  NA 130* 132*  K 3.4* 4.3  CL 98 102  CO2 24 21*  GLUCOSE 152* 116*  BUN 9 6  CREATININE 0.91 0.61  CALCIUM 7.5* 7.7*   LFT Recent Labs    09/20/20 2255  PROT 5.9*  ALBUMIN 1.7*  AST 39  ALT 39  ALKPHOS 113  BILITOT 0.4   PT/INR Recent Labs    09/21/20 0255  LABPROT 15.4*  INR 1.3*   Hepatitis Panel No results for input(s):  HEPBSAG, HCVAB, HEPAIGM, HEPBIGM in the last 72 hours. C-Diff Recent Labs    09/22/20 1412  CDIFFTOX NEGATIVE   No results for input(s): CDIFFPCR in the last 72 hours.   Studies/Results: No results found.  Scheduled Inpatient Medications:   . methylPREDNISolone (SOLU-MEDROL) injection  60 mg Intravenous Daily  . pramoxine-hydrocortisone  1 applicator Topical BID  . zinc oxide   Topical BID    Continuous Inpatient Infusions:   . 0.9 % NaCl with KCl 40 mEq / L 100 mL/hr at 09/23/20 1303  . metronidazole 500 mg (09/23/20 1304)    PRN Inpatient Medications:   morphine injection, ondansetron **OR** ondansetron (ZOFRAN) IV   Assessment:  1. 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 Biopsies pending.  Plan:  1. Continue IV solumedrol. 2. Will add proctofoam. 3. Continue IV Flagyl for rectal disease and fistula. 4. [facility] returning tomorrow to resume care of patient. 5. I will advance to soft diet as per patient request to eat solid food.  Jorgen Wolfinger K. Norma Fredrickson, M.D. 09/23/2020, 2:05 PM

## 2020-09-24 ENCOUNTER — Telehealth: Payer: Self-pay

## 2020-09-24 ENCOUNTER — Encounter: Payer: Self-pay | Admitting: Internal Medicine

## 2020-09-24 DIAGNOSIS — K625 Hemorrhage of anus and rectum: Secondary | ICD-10-CM

## 2020-09-24 DIAGNOSIS — K50913 Crohn's disease, unspecified, with fistula: Secondary | ICD-10-CM

## 2020-09-24 DIAGNOSIS — K50111 Crohn's disease of large intestine with rectal bleeding: Secondary | ICD-10-CM

## 2020-09-24 LAB — CBC
HCT: 30.5 % — ABNORMAL LOW (ref 39.0–52.0)
Hemoglobin: 9.8 g/dL — ABNORMAL LOW (ref 13.0–17.0)
MCH: 23.3 pg — ABNORMAL LOW (ref 26.0–34.0)
MCHC: 32.1 g/dL (ref 30.0–36.0)
MCV: 72.6 fL — ABNORMAL LOW (ref 80.0–100.0)
Platelets: 465 10*3/uL — ABNORMAL HIGH (ref 150–400)
RBC: 4.2 MIL/uL — ABNORMAL LOW (ref 4.22–5.81)
RDW: 16.3 % — ABNORMAL HIGH (ref 11.5–15.5)
WBC: 10.2 10*3/uL (ref 4.0–10.5)
nRBC: 0 % (ref 0.0–0.2)

## 2020-09-24 LAB — POTASSIUM: Potassium: 4.2 mmol/L (ref 3.5–5.1)

## 2020-09-24 LAB — HEPATITIS PANEL, ACUTE
HCV Ab: NONREACTIVE
Hep A IgM: NONREACTIVE
Hep B C IgM: NONREACTIVE
Hepatitis B Surface Ag: NONREACTIVE

## 2020-09-24 MED ORDER — CIPROFLOXACIN HCL 500 MG PO TABS
500.0000 mg | ORAL_TABLET | Freq: Two times a day (BID) | ORAL | Status: DC
  Administered 2020-09-24 – 2020-09-25 (×3): 500 mg via ORAL
  Filled 2020-09-24 (×3): qty 1

## 2020-09-24 MED ORDER — BOOST / RESOURCE BREEZE PO LIQD CUSTOM
1.0000 | Freq: Three times a day (TID) | ORAL | Status: DC
  Administered 2020-09-24 – 2020-09-25 (×4): 1 via ORAL

## 2020-09-24 NOTE — Progress Notes (Signed)
Albert Repress, MD [address]  [address]  [place], [place] [address]  Main: [phone]  Fax: [phone] Pager: [phone]   Subjective: Patient reports feeling somewhat better today.  Awaiting pathology results.  He continues to have ongoing rectal discomfort during defecation.  His stools are becoming somewhat formed.  Objective: Vital signs in last 24 hours: Vitals:   09/23/20 2356 09/24/20 0402 09/24/20 0753 09/24/20 1129  BP: 116/83 117/73 120/83 114/77  Pulse: 76 71 82 77  Resp: 18 18 18 16   Temp: 98.4 F (36.9 C) 97.8 F (36.6 C) 98.9 F (37.2 C) 99.3 F (37.4 C)  TempSrc:    Oral  SpO2: 100% 100% 100% 100%  Weight:      Height:       Weight change:   Intake/Output Summary (Last 24 hours) at 09/24/2020 1505 Last data filed at 09/24/2020 1421 Gross per 24 hour  Intake 1039.2 ml  Output --  Net 1039.2 ml     Exam: Heart:: Regular rate and rhythm, S1S2 present or without murmur or extra heart sounds Lungs: normal and clear to auscultation Abdomen: soft, nontender, normal bowel sounds   Lab Results: CBC Latest Ref Rng & Units 09/24/2020 09/22/2020 09/20/2020  WBC 4.0 - 10.5 K/uL 10.2 6.3 7.1  Hemoglobin 13.0 - 17.0 g/dL 09/22/2020) 10.1(L) 9.2(L)  Hematocrit 39.0 - 52.0 % 30.5(L) 32.4(L) 29.1(L)  Platelets 150 - 400 K/uL 465(H) 432(H) 377   CMP Latest Ref Rng & Units 09/24/2020 09/22/2020 09/20/2020  Glucose 70 - 99 mg/dL - 09/22/2020) 166(A)  BUN 6 - 20 mg/dL - 6 9  Creatinine 630(Z - 1.24 mg/dL - 6.01 0.93  Sodium 2.35 - 145 mmol/L - 132(L) 130(L)  Potassium 3.5 - 5.1 mmol/L 4.2 4.3 3.4(L)  Chloride 98 - 111 mmol/L - 102 98  CO2 22 - 32 mmol/L - 21(L) 24  Calcium 8.9 - 10.3 mg/dL - 7.7(L) 7.5(L)  Total Protein 6.5 - 8.1 g/dL - - 5.9(L)  Total Bilirubin 0.3 - 1.2 mg/dL - - 0.4  Alkaline Phos 38 - 126 U/L - - 113  AST 15 - 41 U/L - - 39  ALT 0 - 44 U/L - - 39    Micro Results: Recent Results (from the past 240 hour(s))  Resp Panel by RT-PCR (Flu  A&B, Covid) Nasopharyngeal Swab     Status: None   Collection Time: 09/21/20  5:03 AM   Specimen: Nasopharyngeal Swab; Nasopharyngeal(NP) swabs in vial transport medium  Result Value Ref Range Status   SARS Coronavirus 2 by RT PCR NEGATIVE NEGATIVE Final    Comment: (NOTE) SARS-CoV-2 target nucleic acids are NOT DETECTED.  The SARS-CoV-2 RNA is generally detectable in upper respiratory specimens during the acute phase of infection. The lowest concentration of SARS-CoV-2 viral copies this assay can detect is 138 copies/mL. A negative result does not preclude SARS-Cov-2 infection and should not be used as the sole basis for treatment or other patient management decisions. A negative result may occur with  improper specimen collection/handling, submission of specimen other than nasopharyngeal swab, presence of viral mutation(s) within the areas targeted by this assay, and inadequate number of viral copies(<138 copies/mL). A negative result must be combined with clinical observations, patient history, and epidemiological information. The expected result is Negative.  Fact Sheet for Patients:  09/23/20  Fact Sheet for Healthcare Providers:  BloggerCourse.com  This test is no t yet approved or cleared by the [place] FDA and  has been  authorized for detection and/or diagnosis of SARS-CoV-2 by FDA under an Emergency Use Authorization (EUA). This EUA will remain  in effect (meaning this test can be used) for the duration of the COVID-19 declaration under Section 564(b)(1) of the Act, 21 U.S.C.section 360bbb-3(b)(1), unless the authorization is terminated  or revoked sooner.       Influenza A by PCR NEGATIVE NEGATIVE Final   Influenza B by PCR NEGATIVE NEGATIVE Final    Comment: (NOTE) The Xpert Xpress SARS-CoV-2/FLU/RSV plus assay is intended as an aid in the diagnosis of influenza from Nasopharyngeal swab specimens  and should not be used as a sole basis for treatment. Nasal washings and aspirates are unacceptable for Xpert Xpress SARS-CoV-2/FLU/RSV testing.  Fact Sheet for Patients: BloggerCourse.com  Fact Sheet for Healthcare Providers: SeriousBroker.it  This test is not yet approved or cleared by the [place] FDA and has been authorized for detection and/or diagnosis of SARS-CoV-2 by FDA under an Emergency Use Authorization (EUA). This EUA will remain in effect (meaning this test can be used) for the duration of the COVID-19 declaration under Section 564(b)(1) of the Act, 21 U.S.C. section 360bbb-3(b)(1), unless the authorization is terminated or revoked.  Performed at [facility], [address] [place], [place] [address]   C Difficile Quick Screen w PCR reflex     Status: None   Collection Time: 09/22/20  2:12 PM   Specimen: STOOL  Result Value Ref Range Status   C Diff antigen NEGATIVE NEGATIVE Final   C Diff toxin NEGATIVE NEGATIVE Final   C Diff interpretation No C. difficile detected.  Final    Comment: Performed at [facility], [address] [place], [place] [address]  Gastrointestinal Panel by PCR , Stool     Status: None   Collection Time: 09/22/20  2:12 PM   Specimen: STOOL  Result Value Ref Range Status   Campylobacter species NOT DETECTED NOT DETECTED Final   Plesimonas shigelloides NOT DETECTED NOT DETECTED Final   Salmonella species NOT DETECTED NOT DETECTED Final   Yersinia enterocolitica NOT DETECTED NOT DETECTED Final   Vibrio species NOT DETECTED NOT DETECTED Final   Vibrio cholerae NOT DETECTED NOT DETECTED Final   Enteroaggregative E coli (EAEC) NOT DETECTED NOT DETECTED Final   Enteropathogenic E coli (EPEC) NOT DETECTED NOT DETECTED Final   Enterotoxigenic E coli (ETEC) NOT DETECTED NOT DETECTED Final   Shiga like toxin producing E coli (STEC) NOT DETECTED NOT DETECTED Final    Shigella/Enteroinvasive E coli (EIEC) NOT DETECTED NOT DETECTED Final   Cryptosporidium NOT DETECTED NOT DETECTED Final   Cyclospora cayetanensis NOT DETECTED NOT DETECTED Final   Entamoeba histolytica NOT DETECTED NOT DETECTED Final   Giardia lamblia NOT DETECTED NOT DETECTED Final   Adenovirus F40/41 NOT DETECTED NOT DETECTED Final   Astrovirus NOT DETECTED NOT DETECTED Final   Norovirus GI/GII NOT DETECTED NOT DETECTED Final   Rotavirus A NOT DETECTED NOT DETECTED Final   Sapovirus (I, II, IV, and V) NOT DETECTED NOT DETECTED Final    Comment: Performed at [facility], [address] [place], [place] [address]   Studies/Results: No results found. Medications:  I have reviewed the patient's current medications. Prior to Admission:  Medications Prior to Admission  Medication Sig Dispense Refill Last Dose  . acetaminophen (TYLENOL) 500 MG tablet Take 1,000 mg by mouth every 6 (six) hours as needed. Do not exceed 4 grams in 24 hours.   prn at prn  . ibuprofen (  ADVIL) 200 MG tablet Take 200 mg by mouth every 6 (six) hours as needed.   prn at prn   Scheduled: . ciprofloxacin  500 mg Oral BID  . feeding supplement  1 Container Oral TID BM  . methylPREDNISolone (SOLU-MEDROL) injection  60 mg Intravenous Daily  . pramoxine-hydrocortisone  1 applicator Topical BID  . zinc oxide   Topical BID   Continuous: . 0.9 % NaCl with KCl 40 mEq / L 100 mL/hr at 09/23/20 1303  . metronidazole 500 mg (09/24/20 1408)   CVE:LFYBOFBP injection, ondansetron **OR** ondansetron (ZOFRAN) IV Anti-infectives (From admission, onward)   Start     Dose/Rate Route Frequency Ordered Stop   09/24/20 1315  ciprofloxacin (CIPRO) tablet 500 mg        500 mg Oral 2 times daily 09/24/20 1216     09/22/20 1430  metroNIDAZOLE (FLAGYL) IVPB 500 mg        500 mg 100 mL/hr over 60 Minutes Intravenous Every 8 hours 09/22/20 1341       Scheduled Meds: . ciprofloxacin  500 mg Oral BID  . feeding supplement   1 Container Oral TID BM  . methylPREDNISolone (SOLU-MEDROL) injection  60 mg Intravenous Daily  . pramoxine-hydrocortisone  1 applicator Topical BID  . zinc oxide   Topical BID   Continuous Infusions: . 0.9 % NaCl with KCl 40 mEq / L 100 mL/hr at 09/23/20 1303  . metronidazole 500 mg (09/24/20 1408)   PRN Meds:.morphine injection, ondansetron **OR** ondansetron (ZOFRAN) IV   Assessment: Principal Problem:   Acute colitis Active Problems:   Acute blood loss anemia   Nicotine dependence   Hypokalemia  Colonoscopy 09/22/2020 - Perianal fistula found on perianal exam. - The examined portion of the ileum was normal. Biopsied. - Erythematous mucosa in the ascending colon. - Normal mucosa in the transverse colon. Biopsied. - Congested mucosa in the rectum, in the sigmoid colon and in the descending colon. - The examination was otherwise normal. - Biopsies performed in the descending colon, in the sigmoid colon and in the rectum.  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  However, patient does not have insurance.  Advised patient to talk to inpatient social worker to apply for [place] charity care   LOS: 3 days   Zavannah Deblois 09/24/2020, 3:05 PM

## 2020-09-24 NOTE — Progress Notes (Signed)
PROGRESS NOTE    Albert Fuentes  ZOX:[id number] DOB: November 10, 1990 DOA: 09/21/2020 PCP: Patient, No Pcp Per    Brief Narrative:  Albert Fuentes is a 30 y.o. male with medical history significant for nicotine dependence, previous opioid overdose and spontaneous pneumothorax who presents to the ER for evaluation of rectal pain and incontinence of stool and bright red blood.  Patient thought his symptoms were related to his known hemorrhoids but got concerned when he developed incontinence of stool.  He has no known history of inflammatory bowel disease but mother has a history of Crohn's disease. Patient states that he has had symptoms for about a week and that the symptoms have progressively worsened.CT scan of abdomen and pelvis shows distal colitis correlating with history of inflammatory bowel disease.  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   2/19-s/p cscope today 2/20-found to be smoking in bathroom by nsg and was told not to smoke again.  C/o pain and wants something stronger than morphine or change interval to be shorter time to give pain med  2/21-pain better, mostly with bowel movement.+diarrhea  Consultants:   GI, Surgery  Procedures:   Antimicrobials:   Metronidazole   Subjective: Still with diarrhea  Objective: Vitals:   09/23/20 1957 09/23/20 2356 09/24/20 0402 09/24/20 0753  BP: 128/87 116/83 117/73 120/83  Pulse: 84 76 71 82  Resp: 18 18 18 18   Temp: 98.1 F (36.7 C) 98.4 F (36.9 C) 97.8 F (36.6 C) 98.9 F (37.2 C)  TempSrc:      SpO2: 100% 100% 100% 100%  Weight:      Height:        Intake/Output Summary (Last 24 hours) at 09/24/2020 0844 Last data filed at 09/23/2020 1600 Gross per 24 hour  Intake 919.2 ml  Output --  Net 919.2 ml   Filed Weights   09/20/20 2252 09/22/20 1200  Weight: 68 kg 68 kg    Examination:  Nad, calm cta no r/w/r Regular s1/s2 Soft mild ttp +bs No edema Aaxox3, grossly intact  Data Reviewed: I have  personally reviewed following labs and imaging studies  CBC: Recent Labs  Lab 09/20/20 2255 09/22/20 0504 09/24/20 0329  WBC 7.1 6.3 10.2  HGB 9.2* 10.1* 9.8*  HCT 29.1* 32.4* 30.5*  MCV 73.1* 73.0* 72.6*  PLT 377 432* 465*   Basic Metabolic Panel: Recent Labs  Lab 09/20/20 2255 09/22/20 0504 09/24/20 0329  NA 130* 132*  --   K 3.4* 4.3 4.2  CL 98 102  --   CO2 24 21*  --   GLUCOSE 152* 116*  --   BUN 9 6  --   CREATININE 0.91 0.61  --   CALCIUM 7.5* 7.7*  --    GFR: Estimated Creatinine Clearance: 132.2 mL/min (by C-G formula based on SCr of 0.61 mg/dL). Liver Function Tests: Recent Labs  Lab 09/20/20 2255  AST 39  ALT 39  ALKPHOS 113  BILITOT 0.4  PROT 5.9*  ALBUMIN 1.7*   No results for input(s): LIPASE, AMYLASE in the last 168 hours. No results for input(s): AMMONIA in the last 168 hours. Coagulation Profile: Recent Labs  Lab 09/21/20 0255  INR 1.3*   Cardiac Enzymes: No results for input(s): CKTOTAL, CKMB, CKMBINDEX, TROPONINI in the last 168 hours. BNP (last 3 results) No results for input(s): PROBNP in the last 8760 hours. HbA1C: No results for input(s): HGBA1C in the last 72 hours. CBG: No results for input(s): GLUCAP in the last 168 hours.  Lipid Profile: No results for input(s): CHOL, HDL, LDLCALC, TRIG, CHOLHDL, LDLDIRECT in the last 72 hours. Thyroid Function Tests: No results for input(s): TSH, T4TOTAL, FREET4, T3FREE, THYROIDAB in the last 72 hours. Anemia Panel: No results for input(s): VITAMINB12, FOLATE, FERRITIN, TIBC, IRON, RETICCTPCT in the last 72 hours. Sepsis Labs: Recent Labs  Lab 09/21/20 0610  LATICACIDVEN 0.8    Recent Results (from the past 240 hour(s))  Resp Panel by RT-PCR (Flu A&B, Covid) Nasopharyngeal Swab     Status: None   Collection Time: 09/21/20  5:03 AM   Specimen: Nasopharyngeal Swab; Nasopharyngeal(NP) swabs in vial transport medium  Result Value Ref Range Status   SARS Coronavirus 2 by RT PCR NEGATIVE  NEGATIVE Final    Comment: (NOTE) SARS-CoV-2 target nucleic acids are NOT DETECTED.  The SARS-CoV-2 RNA is generally detectable in upper respiratory specimens during the acute phase of infection. The lowest concentration of SARS-CoV-2 viral copies this assay can detect is 138 copies/mL. A negative result does not preclude SARS-Cov-2 infection and should not be used as the sole basis for treatment or other patient management decisions. A negative result may occur with  improper specimen collection/handling, submission of specimen other than nasopharyngeal swab, presence of viral mutation(s) within the areas targeted by this assay, and inadequate number of viral copies(<138 copies/mL). A negative result must be combined with clinical observations, patient history, and epidemiological information. The expected result is Negative.  Fact Sheet for Patients:  BloggerCourse.comhttps://www.fda.gov/media/152166/download  Fact Sheet for Healthcare Providers:  SeriousBroker.ithttps://www.fda.gov/media/152162/download  This test is no t yet approved or cleared by the [place] FDA and  has been authorized for detection and/or diagnosis of SARS-CoV-2 by FDA under an Emergency Use Authorization (EUA). This EUA will remain  in effect (meaning this test can be used) for the duration of the COVID-19 declaration under Section 564(b)(1) of the Act, 21 U.S.C.section 360bbb-3(b)(1), unless the authorization is terminated  or revoked sooner.       Influenza A by PCR NEGATIVE NEGATIVE Final   Influenza B by PCR NEGATIVE NEGATIVE Final    Comment: (NOTE) The Xpert Xpress SARS-CoV-2/FLU/RSV plus assay is intended as an aid in the diagnosis of influenza from Nasopharyngeal swab specimens and should not be used as a sole basis for treatment. Nasal washings and aspirates are unacceptable for Xpert Xpress SARS-CoV-2/FLU/RSV testing.  Fact Sheet for Patients: BloggerCourse.comhttps://www.fda.gov/media/152166/download  Fact Sheet for Healthcare  Providers: SeriousBroker.ithttps://www.fda.gov/media/152162/download  This test is not yet approved or cleared by the [place] FDA and has been authorized for detection and/or diagnosis of SARS-CoV-2 by FDA under an Emergency Use Authorization (EUA). This EUA will remain in effect (meaning this test can be used) for the duration of the COVID-19 declaration under Section 564(b)(1) of the Act, 21 U.S.C. section 360bbb-3(b)(1), unless the authorization is terminated or revoked.  Performed at [facility], [address] [place], [place] [address]   C Difficile Quick Screen w PCR reflex     Status: None   Collection Time: 09/22/20  2:12 PM   Specimen: STOOL  Result Value Ref Range Status   C Diff antigen NEGATIVE NEGATIVE Final   C Diff toxin NEGATIVE NEGATIVE Final   C Diff interpretation No C. difficile detected.  Final    Comment: Performed at [facility], [address] [place], [place] [address]  Gastrointestinal Panel by PCR , Stool     Status: None   Collection Time: 09/22/20  2:12 PM   Specimen: STOOL  Result  Value Ref Range Status   Campylobacter species NOT DETECTED NOT DETECTED Final   Plesimonas shigelloides NOT DETECTED NOT DETECTED Final   Salmonella species NOT DETECTED NOT DETECTED Final   Yersinia enterocolitica NOT DETECTED NOT DETECTED Final   Vibrio species NOT DETECTED NOT DETECTED Final   Vibrio cholerae NOT DETECTED NOT DETECTED Final   Enteroaggregative E coli (EAEC) NOT DETECTED NOT DETECTED Final   Enteropathogenic E coli (EPEC) NOT DETECTED NOT DETECTED Final   Enterotoxigenic E coli (ETEC) NOT DETECTED NOT DETECTED Final   Shiga like toxin producing E coli (STEC) NOT DETECTED NOT DETECTED Final   Shigella/Enteroinvasive E coli (EIEC) NOT DETECTED NOT DETECTED Final   Cryptosporidium NOT DETECTED NOT DETECTED Final   Cyclospora cayetanensis NOT DETECTED NOT DETECTED Final   Entamoeba histolytica NOT DETECTED NOT DETECTED Final   Giardia  lamblia NOT DETECTED NOT DETECTED Final   Adenovirus F40/41 NOT DETECTED NOT DETECTED Final   Astrovirus NOT DETECTED NOT DETECTED Final   Norovirus GI/GII NOT DETECTED NOT DETECTED Final   Rotavirus A NOT DETECTED NOT DETECTED Final   Sapovirus (I, II, IV, and V) NOT DETECTED NOT DETECTED Final    Comment: Performed at [facility], [address] [place], [place] [address]         Radiology Studies: No results found.      Scheduled Meds: . methylPREDNISolone (SOLU-MEDROL) injection  60 mg Intravenous Daily  . pramoxine-hydrocortisone  1 applicator Topical BID  . zinc oxide   Topical BID   Continuous Infusions: . 0.9 % NaCl with KCl 40 mEq / L 100 mL/hr at 09/23/20 1303  . metronidazole 500 mg (09/24/20 4403)    Assessment & Plan:   Principal Problem:   Acute colitis Active Problems:   Acute blood loss anemia   Nicotine dependence   Hypokalemia   Acute colitis/perianal fistula Patient presents to the emergency room for evaluation of rectal pain associated with incontinence of stool and bright red blood. 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 difficile and GI panel negative 2/21-crp elevated at 6.2 Protectant pending Will f/u with GI for further input Continue iv steroid and metronidzole for now Apply zinc oxide to  Perianal area Per surgery he does not improve symptomatically from the physical standpoint, may consider to put a CT  First we will try conservative management failed with antibiotic therapy and local care  GI added Protofoam    Acute blood loss anemia Prior hemoglobin was 15g/dl 4 years ago 4/74-Q/V  stable. Hg. 9.8 Continue to monitor      Nicotine dependence Patient was counseled about smoking.  He declined nicotine patch.   2/20-Was told by nursing today patient was smoking in the bathroom and has been told not to do this again.   2/21-no further smoking in bathroom reported   Hypokalemia Was supplemented, K is 4.2 Continue to monitor   DVT prophylaxis: scd Code Status: Full Family Communication: None at bedside  Status is: Inpatient  Remains inpatient appropriate because:Inpatient level of care appropriate due to severity of illness   Dispo: The patient is from: Home              Anticipated d/c is to: Home  Anticipated d/c date is: 3              Patient currently is not medically stable to d/c.   Difficult to place patient No            LOS: 3 days   Time spent: 35 minutes with more than 50% on COC    Lynn Ito, MD [facility] Pager [phone]  If 7PM-7AM, please contact night-coverage 09/24/2020, 8:44 AM

## 2020-09-24 NOTE — Telephone Encounter (Signed)
-----   Message from Toney Reil, MD sent at 09/24/2020  4:23 PM EST ----- Regarding: New patient, Crohn's disease Morrie Sheldon  This is the inpatient with new diagnosis of Crohn's colitis and perianal Crohn's Humira [facility]  Thanks RV

## 2020-09-24 NOTE — Progress Notes (Signed)
SURGICAL PROGRESS NOTE   Hospital Day(s): 3.   Post op day(s): 2 Days Post-Op.   Interval History: Patient seen and examined, no acute events or new complaints overnight. Patient reports feeling more comfortable.  He reported the pain is improving.  He slowly been able to have a easier bowel movement.  Perianal pain also improving slowly.  No pain radiation.  Alleviating factors has been antibiotic therapy and steroid therapy.  Aggravating factor is still having the bowel movement.  Vital signs in last 24 hours: [min-max] current  Temp:  [97.8 F (36.6 C)-99.3 F (37.4 C)] 98.4 F (36.9 C) (02/21 1507) Pulse Rate:  [71-90] 90 (02/21 1507) Resp:  [16-18] 17 (02/21 1507) BP: (114-128)/(73-87) 121/82 (02/21 1507) SpO2:  [100 %] 100 % (02/21 1507)     Height: 6\' 2"  (188 cm) Weight: 68 kg BMI (Calculated): 19.24   Physical Exam:  Constitutional: alert, cooperative and no distress  Respiratory: breathing non-labored at rest  Cardiovascular: regular rate and sinus rhythm  Gastrointestinal: soft, non-tender, and non-distended.  Rectal exam with improved appearance of the perianal fistula.  The erythema has decreased.  Labs:  CBC Latest Ref Rng & Units 09/24/2020 09/22/2020 09/20/2020  WBC 4.0 - 10.5 K/uL 10.2 6.3 7.1  Hemoglobin 13.0 - 17.0 g/dL 09/22/2020) 10.1(L) 9.2(L)  Hematocrit 39.0 - 52.0 % 30.5(L) 32.4(L) 29.1(L)  Platelets 150 - 400 K/uL 465(H) 432(H) 377   CMP Latest Ref Rng & Units 09/24/2020 09/22/2020 09/20/2020  Glucose 70 - 99 mg/dL - 09/22/2020) 563(O)  BUN 6 - 20 mg/dL - 6 9  Creatinine 756(E - 1.24 mg/dL - 3.32 9.51  Sodium 8.84 - 145 mmol/L - 132(L) 130(L)  Potassium 3.5 - 5.1 mmol/L 4.2 4.3 3.4(L)  Chloride 98 - 111 mmol/L - 102 98  CO2 22 - 32 mmol/L - 21(L) 24  Calcium 8.9 - 10.3 mg/dL - 7.7(L) 7.5(L)  Total Protein 6.5 - 8.1 g/dL - - 5.9(L)  Total Bilirubin 0.3 - 1.2 mg/dL - - 0.4  Alkaline Phos 38 - 126 U/L - - 113  AST 15 - 41 U/L - - 39  ALT 0 - 44 U/L - - 39     Imaging studies: No new pertinent imaging studies   Assessment/Plan:  30 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  Patient responding to medical management with antibiotic therapy and steroid therapy.  Abdominal pain and perianal pain both are slowly improving.  Bowel movement are getting better as well.  At this moment I will recommend to continue medical management.  I will continue to follow but no surgical management indicated at this moment.  Agree with recommendation by GI.  26, MD

## 2020-09-24 NOTE — Plan of Care (Signed)

## 2020-09-24 NOTE — Telephone Encounter (Signed)
Dr. Allegra Lai wanted [place] to apply for patient assistance. Sent patient a mychart message in what I need from him to get this done

## 2020-09-25 ENCOUNTER — Other Ambulatory Visit: Payer: Self-pay | Admitting: Internal Medicine

## 2020-09-25 DIAGNOSIS — K50113 Crohn's disease of large intestine with fistula: Principal | ICD-10-CM

## 2020-09-25 LAB — IRON AND TIBC
Iron: 31 ug/dL — ABNORMAL LOW (ref 45–182)
Saturation Ratios: 16 % — ABNORMAL LOW (ref 17.9–39.5)
TIBC: 192 ug/dL — ABNORMAL LOW (ref 250–450)
UIBC: 161 ug/dL

## 2020-09-25 LAB — FOLATE: Folate: 8.1 ng/mL (ref >5.9)

## 2020-09-25 LAB — BASIC METABOLIC PANEL
Anion gap: 6 (ref 5–15)
BUN: 11 mg/dL (ref 6–20)
CO2: 24 mmol/L (ref 22–32)
Calcium: 8 mg/dL — ABNORMAL LOW (ref 8.9–10.3)
Chloride: 106 mmol/L (ref 98–111)
Creatinine, Ser: 0.91 mg/dL (ref 0.61–1.24)
GFR, Estimated: >60 mL/min (ref >60)
Glucose, Bld: 95 mg/dL (ref 70–99)
Potassium: 3.9 mmol/L (ref 3.5–5.1)
Sodium: 136 mmol/L (ref 135–145)

## 2020-09-25 LAB — VITAMIN B12: Vitamin B-12: 834 pg/mL (ref 180–914)

## 2020-09-25 LAB — SURGICAL PATHOLOGY

## 2020-09-25 LAB — FERRITIN: Ferritin: 89 ng/mL (ref 24–336)

## 2020-09-25 MED ORDER — PREDNISONE 20 MG PO TABS: 40.0000 mg | ORAL_TABLET | Freq: Every day | ORAL | 0 refills | Status: DC

## 2020-09-25 MED ORDER — ZINC OXIDE 11.3 % EX CREA: 1.0000 "application " | TOPICAL_CREAM | Freq: Two times a day (BID) | CUTANEOUS | 0 refills | Status: AC

## 2020-09-25 MED ORDER — PREDNISOLONE 5 MG PO TABS
40.0000 mg | ORAL_TABLET | Freq: Every day | ORAL | Status: DC
  Filled 2020-09-25: qty 8

## 2020-09-25 MED ORDER — METRONIDAZOLE 500 MG PO TABS: 500.0000 mg | ORAL_TABLET | Freq: Two times a day (BID) | ORAL | 0 refills | Status: DC

## 2020-09-25 MED ORDER — PREDNISOLONE SODIUM PHOSPHATE 15 MG/5ML PO SOLN
40.0000 mg | Freq: Every day | ORAL | Status: DC
  Administered 2020-09-25: 40 mg via ORAL
  Filled 2020-09-25: qty 13.33

## 2020-09-25 MED ORDER — CIPROFLOXACIN HCL 500 MG PO TABS: 500.0000 mg | ORAL_TABLET | Freq: Two times a day (BID) | ORAL | 0 refills | Status: DC

## 2020-09-25 MED ORDER — METRONIDAZOLE 500 MG PO TABS
500.0000 mg | ORAL_TABLET | Freq: Two times a day (BID) | ORAL | Status: DC
  Administered 2020-09-25: 500 mg via ORAL
  Filled 2020-09-25: qty 1

## 2020-09-25 MED ORDER — PRAMOXINE-HC 1-1 % EX FOAM: 1.0000 | Freq: Two times a day (BID) | CUTANEOUS | 0 refills | Status: DC

## 2020-09-25 MED ORDER — OXYCODONE-ACETAMINOPHEN 7.5-325 MG PO TABS: 1.0000 | ORAL_TABLET | Freq: Four times a day (QID) | ORAL | 0 refills | Status: AC | PRN

## 2020-09-25 NOTE — TOC Progression Note (Signed)
Transition of Care [facility]) - Progression Note    Patient Details  Name: Albert Fuentes MRN: [id number] Date of Birth: 26-Oct-1990  [facility] of [facility]) CM/SW Contact  Trenton Founds, RN Phone Number: 09/25/2020, 9:56 AM  Clinical Narrative:   RNCM reached out to financial counselor, they will assess for Medicaid potential.          Expected Discharge Plan and Services                                                 Social Determinants of Health (SDOH) Interventions    Readmission Risk Interventions No flowsheet data found.

## 2020-09-25 NOTE — Progress Notes (Signed)
Discharge Note: Reviewed discharge instructions with pt. Pt verbalized understanding.  Pt discharged with personal belongings. Staff walked pt out. Pt transported via private vehicle.

## 2020-09-25 NOTE — Telephone Encounter (Signed)
Called patient room in the hospital and informed him of the information we would need to get the patient assistance for patient. He states when he gets out of the hospital he will get [place] this information.

## 2020-09-25 NOTE — Discharge Summary (Signed)
Albert Fuentes ZOX:[id number] DOB: 1990/12/02 DOA: 09/21/2020  PCP: Patient, No Pcp Per  Admit date: 09/21/2020 Discharge date: 09/25/2020  Admitted From: home Disposition:  home  Recommendations for Outpatient Follow-up:  1. Follow up with PCP in 1 week 2. Please obtain BMP/CBC in one week 3. Follow up with surgery Dr. Hazle Quant in 2 weeks 4. F/u with GI in 2 week      Discharge Condition:Stable CODE STATUS:full  Diet recommendation: Regular  Brief/Interim Summary: Per [name] is a 30 y.o. male with medical history significant for nicotine dependence, previous opioid overdose and spontaneous pneumothorax who presents to the ER for evaluation of rectal pain and incontinence of stool and bright red blood.  Patient thought his symptoms were related to his known hemorrhoids but got concerned when he developed incontinence of stool.  He has no known history of inflammatory bowel disease but mother has a history of Crohn's disease. Patient states that he has had symptoms for about a week and that the symptoms have progressively worsened.CT scan of abdomen and pelvis shows distal colitis correlating with history of inflammatory bowel disease.  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 GI was consulted.  Patient was admitted.   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 difficile and GI panel negative crp elevated at 6.2 Started on IV antibiotics with Cipro and metronidazole Also started on IV steroids Surgery followed felt continue medical management as he was improving and no surgical intervention at this time GI cleared pt for discharge today as he was doing  better Per Gi on discharge Continue metronidazole and ciprofloxacin twice daily atleast for 4 weeks start prednisone 40 mg daily, recommend this dose for 2 to 3 weeks followed by taper by 10 mg weekly., but spoke with Dr. Allegra Lai, will dc on 40mg  qd until f/u with her in 2 weeks and she will start taper. QuantiFERON gold in process, viral hepatitis panel normal Calprotectin levels pending We will try for Humira patient assistance program as outpatient Patient will need follow-up with me in 2 to 3 weeks     Acute blood loss anemia H/h remained stable.      Nicotine dependence Patient was counseled about smoking.  He declined nicotine patch.   2/20-Was told by nursing today patient was smoking in the bathroom and has been told not to do this again.   2/21-no further smoking in bathroom reported   Hypokalemia Was supplemented, and remained stable.  Discharge Diagnoses:  Principal Problem:   Acute colitis Active Problems:   Acute blood loss anemia   Nicotine dependence   Hypokalemia    Discharge Instructions  Discharge Instructions    Call MD for:  severe uncontrolled pain   Complete by: As directed    Call MD for:  temperature >100.4   Complete by: As directed    Diet - low sodium heart healthy   Complete by: As directed    Discharge instructions   Complete by: As directed    Follow up with GI Dr. 08-14-1997   Increase activity slowly   Complete by: As directed      Allergies as of 09/25/2020   No Known Allergies     Medication List    STOP taking  these medications   acetaminophen 500 MG tablet Commonly known as: TYLENOL   ibuprofen 200 MG tablet Commonly known as: ADVIL     TAKE these medications   ciprofloxacin 500 MG tablet Commonly known as: CIPRO Take 1 tablet (500 mg total) by mouth 2 (two) times daily.   metroNIDAZOLE 500 MG tablet Commonly known as: FLAGYL Take 1 tablet (500 mg total) by mouth every 12 (twelve) hours.    oxyCODONE-acetaminophen 7.5-325 MG tablet Commonly known as: Percocet Take 1 tablet by mouth every 6 (six) hours as needed for up to 3 days for severe pain.   pramoxine-hydrocortisone 1-1 % foam Apply 1 applicator topically 2 (two) times daily for 19 days.   predniSONE 20 MG tablet Commonly known as: DELTASONE Take 2 tablets (40 mg total) by mouth daily.   zinc oxide 11.3 % Crea cream Commonly known as: BALMEX Apply 1 application topically 2 (two) times daily for 56 doses.       Follow-up Information    Toney Reil, MD Follow up in 2 week(s).   Specialty: Gastroenterology Contact information: [address] [place] [address] [phone]        Carolan Shiver, MD Follow up in 2 week(s).   Specialty: General Surgery Contact information: [address] [place] [address] [phone]              No Known Allergies  Consultations:  GI and surgery   Procedures/Studies: CT ABDOMEN PELVIS W CONTRAST  Result Date: 09/21/2020 CLINICAL DATA:  Inflammatory bowel disease. Hemorrhoids. Anal fistula EXAM: CT ABDOMEN AND PELVIS WITH CONTRAST TECHNIQUE: Multidetector CT imaging of the abdomen and pelvis was performed using the standard protocol following bolus administration of intravenous contrast. CONTRAST:  OMNIPAQUE IOHEXOL 300 MG/ML  SOLN COMPARISON:  07/24/2016 FINDINGS: Lower chest:  No contributory findings. Hepatobiliary: No focal liver abnormality.No evidence of biliary obstruction or stone. Pancreas: Unremarkable. Spleen: Unremarkable. Adrenals/Urinary Tract: Negative adrenals. No hydronephrosis or stone. Unremarkable bladder. Stomach/Bowel: Rectosigmoid and descending colonic wall thickening with hyperenhancing mucosa and submucosal low-density edematous appearance. The associated mesentery shows prominent vessels and mild fat stranding. There is a left perianal fistula at the 3 o'clock position which contains some fluid and  gas. Relationship to the sphincter is uncertain on this study. Vascular/Lymphatic: Mild prominence of retroperitoneal and mesenteric lymph nodes considered reactive in this setting. No vascular findings. Reproductive:Edematous appearance to the scrotal wall, presumably reactive. Other: No ascites or pneumoperitoneum. Musculoskeletal: No acute abnormalities. No visible spondyloarthropathy. IMPRESSION: 1. Distal colitis correlating with history of inflammatory bowel disease. 2. 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 Electronically Signed   By: Marnee Spring M.D.   On: 09/21/2020 04:56      Subjective: Diarrhea is improving and less frequent.  Abdominal pain improving  Discharge Exam: Vitals:   09/25/20 0804 09/25/20 1153  BP: 117/88 119/87  Pulse: 69 86  Resp: 16 16  Temp: 99.2 F (37.3 C) 98.5 F (36.9 C)  SpO2: 100% 100%   Vitals:   09/24/20 1951 09/25/20 0409 09/25/20 0804 09/25/20 1153  BP: 123/83 117/79 117/88 119/87  Pulse: 78 (!) 55 69 86  Resp: Temp:  98.3 F (36.8 C) 99.2 F (37.3 C) 98.5 F (36.9 C)  TempSrc: Oral Oral  Oral  SpO2: 100% 100% 100% 100%  Weight:      Height:        General: Pt is alert, awake, not in acute distress Cardiovascular: RRR,  S1/S2 +, no rubs, no gallops Respiratory: CTA bilaterally, no wheezing, no rhonchi Abdominal: Soft, NT, ND, bowel sounds + Extremities: no edema, no cyanosis    The results of significant diagnostics from this hospitalization (including imaging, microbiology, ancillary and laboratory) are listed below for reference.     Microbiology: Recent Results (from the past 240 hour(s))  Resp Panel by RT-PCR (Flu A&B, Covid) Nasopharyngeal Swab     Status: None   Collection Time: 09/21/20  5:03 AM   Specimen: Nasopharyngeal Swab; Nasopharyngeal(NP) swabs in vial transport medium  Result Value Ref Range Status   SARS Coronavirus 2 by RT PCR NEGATIVE NEGATIVE Final    Comment: (NOTE) SARS-CoV-2 target  nucleic acids are NOT DETECTED.  The SARS-CoV-2 RNA is generally detectable in upper respiratory specimens during the acute phase of infection. The lowest concentration of SARS-CoV-2 viral copies this assay can detect is 138 copies/mL. A negative result does not preclude SARS-Cov-2 infection and should not be used as the sole basis for treatment or other patient management decisions. A negative result may occur with  improper specimen collection/handling, submission of specimen other than nasopharyngeal swab, presence of viral mutation(s) within the areas targeted by this assay, and inadequate number of viral copies(<138 copies/mL). A negative result must be combined with clinical observations, patient history, and epidemiological information. The expected result is Negative.  Fact Sheet for Patients:  BloggerCourse.comhttps://www.fda.gov/media/152166/download  Fact Sheet for Healthcare Providers:  SeriousBroker.ithttps://www.fda.gov/media/152162/download  This test is no t yet approved or cleared by the [place] FDA and  has been authorized for detection and/or diagnosis of SARS-CoV-2 by FDA under an Emergency Use Authorization (EUA). This EUA will remain  in effect (meaning this test can be used) for the duration of the COVID-19 declaration under Section 564(b)(1) of the Act, 21 U.S.C.section 360bbb-3(b)(1), unless the authorization is terminated  or revoked sooner.       Influenza A by PCR NEGATIVE NEGATIVE Final   Influenza B by PCR NEGATIVE NEGATIVE Final    Comment: (NOTE) The Xpert Xpress SARS-CoV-2/FLU/RSV plus assay is intended as an aid in the diagnosis of influenza from Nasopharyngeal swab specimens and should not be used as a sole basis for treatment. Nasal washings and aspirates are unacceptable for Xpert Xpress SARS-CoV-2/FLU/RSV testing.  Fact Sheet for Patients: BloggerCourse.comhttps://www.fda.gov/media/152166/download  Fact Sheet for Healthcare  Providers: SeriousBroker.ithttps://www.fda.gov/media/152162/download  This test is not yet approved or cleared by the [place] FDA and has been authorized for detection and/or diagnosis of SARS-CoV-2 by FDA under an Emergency Use Authorization (EUA). This EUA will remain in effect (meaning this test can be used) for the duration of the COVID-19 declaration under Section 564(b)(1) of the Act, 21 U.S.C. section 360bbb-3(b)(1), unless the authorization is terminated or revoked.  Performed at [facility], [address] [place], [place] [address]   C Difficile Quick Screen w PCR reflex     Status: None   Collection Time: 09/22/20  2:12 PM   Specimen: STOOL  Result Value Ref Range Status   C Diff antigen NEGATIVE NEGATIVE Final   C Diff toxin NEGATIVE NEGATIVE Final   C Diff interpretation No C. difficile detected.  Final    Comment: Performed at [facility], [address] [place], [place] [address]  Gastrointestinal Panel by PCR , Stool     Status: None   Collection Time: 09/22/20  2:12 PM   Specimen: STOOL  Result Value Ref Range Status   Campylobacter species NOT DETECTED NOT DETECTED Final  Plesimonas shigelloides NOT DETECTED NOT DETECTED Final   Salmonella species NOT DETECTED NOT DETECTED Final   Yersinia enterocolitica NOT DETECTED NOT DETECTED Final   Vibrio species NOT DETECTED NOT DETECTED Final   Vibrio cholerae NOT DETECTED NOT DETECTED Final   Enteroaggregative E coli (EAEC) NOT DETECTED NOT DETECTED Final   Enteropathogenic E coli (EPEC) NOT DETECTED NOT DETECTED Final   Enterotoxigenic E coli (ETEC) NOT DETECTED NOT DETECTED Final   Shiga like toxin producing E coli (STEC) NOT DETECTED NOT DETECTED Final   Shigella/Enteroinvasive E coli (EIEC) NOT DETECTED NOT DETECTED Final   Cryptosporidium NOT DETECTED NOT DETECTED Final   Cyclospora cayetanensis NOT DETECTED NOT DETECTED Final   Entamoeba histolytica NOT DETECTED NOT DETECTED Final   Giardia  lamblia NOT DETECTED NOT DETECTED Final   Adenovirus F40/41 NOT DETECTED NOT DETECTED Final   Astrovirus NOT DETECTED NOT DETECTED Final   Norovirus GI/GII NOT DETECTED NOT DETECTED Final   Rotavirus A NOT DETECTED NOT DETECTED Final   Sapovirus (I, II, IV, and V) NOT DETECTED NOT DETECTED Final    Comment: Performed at [facility], [address] [place], [place] [address]     Labs: BNP (last 3 results) No results for input(s): BNP in the last 8760 hours. Basic Metabolic Panel: Recent Labs  Lab 09/20/20 2255 09/22/20 0504 09/24/20 0329 09/25/20 0648  NA 130* 132*  --  136  K 3.4* 4.3 4.2 3.9  CL 98 102  --  106  CO2 24 21*  --  24  GLUCOSE 152* 116*  --  95  BUN 9 6  --  11  CREATININE 0.91 0.61  --  0.91  CALCIUM 7.5* 7.7*  --  8.0*   Liver Function Tests: Recent Labs  Lab 09/20/20 2255  AST 39  ALT 39  ALKPHOS 113  BILITOT 0.4  PROT 5.9*  ALBUMIN 1.7*   No results for input(s): LIPASE, AMYLASE in the last 168 hours. No results for input(s): AMMONIA in the last 168 hours. CBC: Recent Labs  Lab 09/20/20 2255 09/22/20 0504 09/24/20 0329  WBC 7.1 6.3 10.2  HGB 9.2* 10.1* 9.8*  HCT 29.1* 32.4* 30.5*  MCV 73.1* 73.0* 72.6*  PLT 377 432* 465*   Cardiac Enzymes: No results for input(s): CKTOTAL, CKMB, CKMBINDEX, TROPONINI in the last 168 hours. BNP: Invalid input(s): POCBNP CBG: No results for input(s): GLUCAP in the last 168 hours. D-Dimer No results for input(s): DDIMER in the last 72 hours. Hgb A1c No results for input(s): HGBA1C in the last 72 hours. Lipid Profile No results for input(s): CHOL, HDL, LDLCALC, TRIG, CHOLHDL, LDLDIRECT in the last 72 hours. Thyroid function studies No results for input(s): TSH, T4TOTAL, T3FREE, THYROIDAB in the last 72 hours.  Invalid input(s): FREET3 Anemia work up Recent Labs    09/25/20 1237  FOLATE 8.1  FERRITIN 89  TIBC 192*  IRON 31*   Urinalysis    Component Value Date/Time   COLORURINE  YELLOW (A) 09/21/2020 1041   APPEARANCEUR CLEAR (A) 09/21/2020 1041   LABSPEC 1.018 09/21/2020 1041   PHURINE 7.0 09/21/2020 1041   GLUCOSEU NEGATIVE 09/21/2020 1041   HGBUR NEGATIVE 09/21/2020 1041   BILIRUBINUR NEGATIVE 09/21/2020 1041   KETONESUR NEGATIVE 09/21/2020 1041   PROTEINUR NEGATIVE 09/21/2020 1041   NITRITE NEGATIVE 09/21/2020 1041   LEUKOCYTESUR NEGATIVE 09/21/2020 1041   Sepsis Labs Invalid input(s): PROCALCITONIN,  WBC,  LACTICIDVEN Microbiology Recent Results (from the past 240 hour(s))  Resp Panel by  RT-PCR (Flu A&B, Covid) Nasopharyngeal Swab     Status: None   Collection Time: 09/21/20  5:03 AM   Specimen: Nasopharyngeal Swab; Nasopharyngeal(NP) swabs in vial transport medium  Result Value Ref Range Status   SARS Coronavirus 2 by RT PCR NEGATIVE NEGATIVE Final    Comment: (NOTE) SARS-CoV-2 target nucleic acids are NOT DETECTED.  The SARS-CoV-2 RNA is generally detectable in upper respiratory specimens during the acute phase of infection. The lowest concentration of SARS-CoV-2 viral copies this assay can detect is 138 copies/mL. A negative result does not preclude SARS-Cov-2 infection and should not be used as the sole basis for treatment or other patient management decisions. A negative result may occur with  improper specimen collection/handling, submission of specimen other than nasopharyngeal swab, presence of viral mutation(s) within the areas targeted by this assay, and inadequate number of viral copies(<138 copies/mL). A negative result must be combined with clinical observations, patient history, and epidemiological information. The expected result is Negative.  Fact Sheet for Patients:  BloggerCourse.com  Fact Sheet for Healthcare Providers:  SeriousBroker.it  This test is no t yet approved or cleared by the [place] FDA and  has been authorized for detection and/or diagnosis of SARS-CoV-2  by FDA under an Emergency Use Authorization (EUA). This EUA will remain  in effect (meaning this test can be used) for the duration of the COVID-19 declaration under Section 564(b)(1) of the Act, 21 U.S.C.section 360bbb-3(b)(1), unless the authorization is terminated  or revoked sooner.       Influenza A by PCR NEGATIVE NEGATIVE Final   Influenza B by PCR NEGATIVE NEGATIVE Final    Comment: (NOTE) The Xpert Xpress SARS-CoV-2/FLU/RSV plus assay is intended as an aid in the diagnosis of influenza from Nasopharyngeal swab specimens and should not be used as a sole basis for treatment. Nasal washings and aspirates are unacceptable for Xpert Xpress SARS-CoV-2/FLU/RSV testing.  Fact Sheet for Patients: BloggerCourse.com  Fact Sheet for Healthcare Providers: SeriousBroker.it  This test is not yet approved or cleared by the [place] FDA and has been authorized for detection and/or diagnosis of SARS-CoV-2 by FDA under an Emergency Use Authorization (EUA). This EUA will remain in effect (meaning this test can be used) for the duration of the COVID-19 declaration under Section 564(b)(1) of the Act, 21 U.S.C. section 360bbb-3(b)(1), unless the authorization is terminated or revoked.  Performed at [facility], [address] [place], [place] [address]   C Difficile Quick Screen w PCR reflex     Status: None   Collection Time: 09/22/20  2:12 PM   Specimen: STOOL  Result Value Ref Range Status   C Diff antigen NEGATIVE NEGATIVE Final   C Diff toxin NEGATIVE NEGATIVE Final   C Diff interpretation No C. difficile detected.  Final    Comment: Performed at [facility], [address] [place], [place] [address]  Gastrointestinal Panel by PCR , Stool     Status: None   Collection Time: 09/22/20  2:12 PM   Specimen: STOOL  Result Value Ref Range Status   Campylobacter species NOT DETECTED NOT DETECTED Final    Plesimonas shigelloides NOT DETECTED NOT DETECTED Final   Salmonella species NOT DETECTED NOT DETECTED Final   Yersinia enterocolitica NOT DETECTED NOT DETECTED Final   Vibrio species NOT DETECTED NOT DETECTED Final   Vibrio cholerae NOT DETECTED NOT DETECTED Final   Enteroaggregative E coli (EAEC) NOT DETECTED NOT DETECTED Final   Enteropathogenic E coli (EPEC) NOT  DETECTED NOT DETECTED Final   Enterotoxigenic E coli (ETEC) NOT DETECTED NOT DETECTED Final   Shiga like toxin producing E coli (STEC) NOT DETECTED NOT DETECTED Final   Shigella/Enteroinvasive E coli (EIEC) NOT DETECTED NOT DETECTED Final   Cryptosporidium NOT DETECTED NOT DETECTED Final   Cyclospora cayetanensis NOT DETECTED NOT DETECTED Final   Entamoeba histolytica NOT DETECTED NOT DETECTED Final   Giardia lamblia NOT DETECTED NOT DETECTED Final   Adenovirus F40/41 NOT DETECTED NOT DETECTED Final   Astrovirus NOT DETECTED NOT DETECTED Final   Norovirus GI/GII NOT DETECTED NOT DETECTED Final   Rotavirus A NOT DETECTED NOT DETECTED Final   Sapovirus (I, II, IV, and V) NOT DETECTED NOT DETECTED Final    Comment: Performed at [facility], [address], [place], [place] [address]     Time coordinating discharge: Over 30 minutes  SIGNED:   Lynn Ito, MD  [facility] 09/25/2020, 3:03 PM Pager   If 7PM-7AM, please contact night-coverage [email or web address] Password [email or web address]

## 2020-09-25 NOTE — Progress Notes (Signed)
Albert Repress, MD [address]  [address]  [place], [place] [address]  Main: [phone]  Fax: [phone] Pager: [phone]   Subjective: Patient continues to feel better.  Objective: Vital signs in last 24 hours: Vitals:   09/24/20 1951 09/25/20 0409 09/25/20 0804 09/25/20 1153  BP: 123/83 117/79 117/88 119/87  Pulse: 78 (!) 55 69 86  Resp: 17 16 16 16   Temp:  98.3 F (36.8 C) 99.2 F (37.3 C) 98.5 F (36.9 C)  TempSrc: Oral Oral  Oral  SpO2: 100% 100% 100% 100%  Weight:      Height:       Weight change:   Intake/Output Summary (Last 24 hours) at 09/25/2020 1407 Last data filed at 09/25/2020 1356 Gross per 24 hour  Intake 4409.89 ml  Output --  Net 4409.89 ml     Exam: Heart:: Regular rate and rhythm, S1S2 present or without murmur or extra heart sounds Lungs: normal and clear to auscultation Abdomen: soft, nontender, normal bowel sounds   Lab Results: CBC Latest Ref Rng & Units 09/24/2020 09/22/2020 09/20/2020  WBC 4.0 - 10.5 K/uL 10.2 6.3 7.1  Hemoglobin 13.0 - 17.0 g/dL 09/22/2020) 10.1(L) 9.2(L)  Hematocrit 39.0 - 52.0 % 30.5(L) 32.4(L) 29.1(L)  Platelets 150 - 400 K/uL 465(H) 432(H) 377   CMP Latest Ref Rng & Units 09/25/2020 09/24/2020 09/22/2020  Glucose 70 - 99 mg/dL 95 - 09/24/2020)  BUN 6 - 20 mg/dL 11 - 6  Creatinine 818(E - 1.24 mg/dL 9.93 - 7.16  Sodium 9.67 - 145 mmol/L 136 - 132(L)  Potassium 3.5 - 5.1 mmol/L 3.9 4.2 4.3  Chloride 98 - 111 mmol/L 106 - 102  CO2 22 - 32 mmol/L 24 - 21(L)  Calcium 8.9 - 10.3 mg/dL 8.0(L) - 7.7(L)  Total Protein 6.5 - 8.1 g/dL - - -  Total Bilirubin 0.3 - 1.2 mg/dL - - -  Alkaline Phos 38 - 126 U/L - - -  AST 15 - 41 U/L - - -  ALT 0 - 44 U/L - - -    Micro Results: Recent Results (from the past 240 hour(s))  Resp Panel by RT-PCR (Flu A&B, Covid) Nasopharyngeal Swab     Status: None   Collection Time: 09/21/20  5:03 AM   Specimen: Nasopharyngeal Swab; Nasopharyngeal(NP) swabs in vial transport medium   Result Value Ref Range Status   SARS Coronavirus 2 by RT PCR NEGATIVE NEGATIVE Final    Comment: (NOTE) SARS-CoV-2 target nucleic acids are NOT DETECTED.  The SARS-CoV-2 RNA is generally detectable in upper respiratory specimens during the acute phase of infection. The lowest concentration of SARS-CoV-2 viral copies this assay can detect is 138 copies/mL. A negative result does not preclude SARS-Cov-2 infection and should not be used as the sole basis for treatment or other patient management decisions. A negative result may occur with  improper specimen collection/handling, submission of specimen other than nasopharyngeal swab, presence of viral mutation(s) within the areas targeted by this assay, and inadequate number of viral copies(<138 copies/mL). A negative result must be combined with clinical observations, patient history, and epidemiological information. The expected result is Negative.  Fact Sheet for Patients:  09/23/20  Fact Sheet for Healthcare Providers:  BloggerCourse.com  This test is no t yet approved or cleared by the [place] FDA and  has been authorized for detection and/or diagnosis of SARS-CoV-2 by FDA under an Emergency Use Authorization (EUA). This EUA will remain  in effect (meaning this  test can be used) for the duration of the COVID-19 declaration under Section 564(b)(1) of the Act, 21 U.S.C.section 360bbb-3(b)(1), unless the authorization is terminated  or revoked sooner.       Influenza A by PCR NEGATIVE NEGATIVE Final   Influenza B by PCR NEGATIVE NEGATIVE Final    Comment: (NOTE) The Xpert Xpress SARS-CoV-2/FLU/RSV plus assay is intended as an aid in the diagnosis of influenza from Nasopharyngeal swab specimens and should not be used as a sole basis for treatment. Nasal washings and aspirates are unacceptable for Xpert Xpress SARS-CoV-2/FLU/RSV testing.  Fact Sheet for  Patients: BloggerCourse.com  Fact Sheet for Healthcare Providers: SeriousBroker.it  This test is not yet approved or cleared by the [place] FDA and has been authorized for detection and/or diagnosis of SARS-CoV-2 by FDA under an Emergency Use Authorization (EUA). This EUA will remain in effect (meaning this test can be used) for the duration of the COVID-19 declaration under Section 564(b)(1) of the Act, 21 U.S.C. section 360bbb-3(b)(1), unless the authorization is terminated or revoked.  Performed at [facility], [address] [place], [place] [address]   C Difficile Quick Screen w PCR reflex     Status: None   Collection Time: 09/22/20  2:12 PM   Specimen: STOOL  Result Value Ref Range Status   C Diff antigen NEGATIVE NEGATIVE Final   C Diff toxin NEGATIVE NEGATIVE Final   C Diff interpretation No C. difficile detected.  Final    Comment: Performed at [facility], [address] [place], [place] [address]  Gastrointestinal Panel by PCR , Stool     Status: None   Collection Time: 09/22/20  2:12 PM   Specimen: STOOL  Result Value Ref Range Status   Campylobacter species NOT DETECTED NOT DETECTED Final   Plesimonas shigelloides NOT DETECTED NOT DETECTED Final   Salmonella species NOT DETECTED NOT DETECTED Final   Yersinia enterocolitica NOT DETECTED NOT DETECTED Final   Vibrio species NOT DETECTED NOT DETECTED Final   Vibrio cholerae NOT DETECTED NOT DETECTED Final   Enteroaggregative E coli (EAEC) NOT DETECTED NOT DETECTED Final   Enteropathogenic E coli (EPEC) NOT DETECTED NOT DETECTED Final   Enterotoxigenic E coli (ETEC) NOT DETECTED NOT DETECTED Final   Shiga like toxin producing E coli (STEC) NOT DETECTED NOT DETECTED Final   Shigella/Enteroinvasive E coli (EIEC) NOT DETECTED NOT DETECTED Final   Cryptosporidium NOT DETECTED NOT DETECTED Final   Cyclospora cayetanensis NOT DETECTED NOT  DETECTED Final   Entamoeba histolytica NOT DETECTED NOT DETECTED Final   Giardia lamblia NOT DETECTED NOT DETECTED Final   Adenovirus F40/41 NOT DETECTED NOT DETECTED Final   Astrovirus NOT DETECTED NOT DETECTED Final   Norovirus GI/GII NOT DETECTED NOT DETECTED Final   Rotavirus A NOT DETECTED NOT DETECTED Final   Sapovirus (I, II, IV, and V) NOT DETECTED NOT DETECTED Final    Comment: Performed at [facility], [address] [place], [place] [address]   Studies/Results: No results found. Medications:  I have reviewed the patient's current medications. Prior to Admission:  Medications Prior to Admission  Medication Sig Dispense Refill Last Dose  . acetaminophen (TYLENOL) 500 MG tablet Take 1,000 mg by mouth every 6 (six) hours as needed. Do not exceed 4 grams in 24 hours.   prn at prn  . ibuprofen (ADVIL) 200 MG tablet Take 200 mg by mouth every 6 (six) hours as needed.   prn at prn   Scheduled: .  ciprofloxacin  500 mg Oral BID  . feeding supplement  1 Container Oral TID BM  . metroNIDAZOLE  500 mg Oral Q12H  . pramoxine-hydrocortisone  1 applicator Topical BID  . prednisoLONE  40 mg Oral Daily  . zinc oxide   Topical BID   Continuous:  JJK:[id number] injection, ondansetron **OR** ondansetron (ZOFRAN) IV Anti-infectives (From admission, onward)   Start     Dose/Rate Route Frequency Ordered Stop   09/25/20 1315  metroNIDAZOLE (FLAGYL) tablet 500 mg        500 mg Oral Every 12 hours 09/25/20 1225     09/24/20 1315  ciprofloxacin (CIPRO) tablet 500 mg        500 mg Oral 2 times daily 09/24/20 1216     09/22/20 1430  metroNIDAZOLE (FLAGYL) IVPB 500 mg  Status:  Discontinued        500 mg 100 mL/hr over 60 Minutes Intravenous Every 8 hours 09/22/20 1341 09/25/20 1225     Scheduled Meds: . ciprofloxacin  500 mg Oral BID  . feeding supplement  1 Container Oral TID BM  . metroNIDAZOLE  500 mg Oral Q12H  . pramoxine-hydrocortisone  1 applicator Topical BID  .  prednisoLONE  40 mg Oral Daily  . 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 - The examined portion of the ileum was normal. Biopsied. - Erythematous mucosa in the ascending colon. - Normal mucosa in the transverse colon. Biopsied. - Congested mucosa in the rectum, in the sigmoid colon and in the descending colon. - The examination was otherwise normal. - Biopsies performed in the descending colon, in the sigmoid colon and in the rectum.  DIAGNOSIS:  A. TERMINAL ILEUM; COLD BIOPSY:  - ENTERIC MUCOSA DISPLAYING NORMAL VILLOUS ARCHITECTURE AND REACTIVE  LYMPHOID HYPERPLASIA.  - NEGATIVE FOR ACTIVE ILEITIS.  - NEGATIVE FOR GRANULOMA, DYSPLASIA, AND MALIGNANCY.   B. COLON, ASCENDING; COLD BIOPSY: BENIGN COLONIC MUCOSA WITH NO  SIGNIFICANT HISTOPATHOLOGIC CHANGE.  - NEGATIVE FOR ACTIVE MUCOSAL COLITIS AND ARCHITECTURAL CHANGES OF  CHRONIC COLITIS.  - NEGATIVE FOR DYSPLASIA AND MALIGNANCY.   C. COLON, TRANSVERSE; COLD BIOPSY:  - NEGATIVE FOR ACTIVE MUCOSAL COLITIS AND ARCHITECTURAL CHANGES OF  CHRONIC COLITIS.  - NEGATIVE FOR GRANULOMA, DYSPLASIA, AND MALIGNANCY.   D. COLON, DESCENDING; COLD BIOPSY:  - CHRONIC COLITIS WITH SEVERE ACTIVITY (CRYPTITIS, CRYPT ABSCESSES, AND  ULCERATION).  - NEGATIVE FOR GRANULOMA, DYSPLASIA, AND MALIGNANCY.   E. COLON, SIGMOID; COLD BIOPSY:  - CHRONIC COLITIS WITH SEVERE ACTIVITY (CRYPTITIS, CRYPT ABSCESSES, AND  ULCERATION).  - NEGATIVE FOR GRANULOMA, DYSPLASIA, AND MALIGNANCY.   F. RECTUM; COLD BIOPSY:  - CHRONIC PROCTITIS WITH MILD ACTIVITY (CRYPTITIS).  - NEGATIVE FOR GRANULOMA, DYSPLASIA, AND MALIGNANCY.   Comment:  A single non-necrotizing granuloma is  identified in the ascending colon  (part B), although architectural changes of chronic colitis and active  mucosal inflammation are not present.  Plan: Severe Crohn's colitis, left > right, biopsy proven with perianal fistula Continue metronidazole and ciprofloxacin twice daily atleast for 4 weeks start prednisone 40 mg daily, recommend this dose for 2 to 3 weeks followed by taper by 10 mg weekly QuantiFERON gold in process, viral hepatitis panel normal Calprotectin levels pending We will try for Humira patient  assistance program as outpatient Patient can be discharged home from GI standpoint Patient will need follow-up with me in 2 to 3 weeks   LOS: 4 days   Karter Hellmer 09/25/2020, 2:07 PM

## 2020-09-26 ENCOUNTER — Telehealth: Payer: Self-pay

## 2020-09-26 DIAGNOSIS — Z111 Encounter for screening for respiratory tuberculosis: Secondary | ICD-10-CM

## 2020-09-26 LAB — CALPROTECTIN, FECAL: Calprotectin, Fecal: 563 ug/g — ABNORMAL HIGH (ref 0–120)

## 2020-09-26 LAB — QUANTIFERON-TB GOLD PLUS (RQFGPL)
QuantiFERON Mitogen Value: 0.51 IU/mL
QuantiFERON Nil Value: 0.02 IU/mL
QuantiFERON TB1 Ag Value: 0.02 IU/mL
QuantiFERON TB2 Ag Value: 0.03 IU/mL

## 2020-09-26 LAB — QUANTIFERON-TB GOLD PLUS: QuantiFERON-TB Gold Plus: UNDETERMINED — AB

## 2020-09-26 NOTE — Telephone Encounter (Signed)
Patient will go get xray done this week. He is going to get is finance records together and bring them by the office. He states that he will fill out the form for Humira also. Moved appointment up to 10/15/2020

## 2020-09-26 NOTE — Telephone Encounter (Signed)
Called and left a message for call back. Order chest xray

## 2020-09-26 NOTE — Telephone Encounter (Signed)
-----   Message from Toney Reil, MD sent at 09/26/2020  8:45 AM EST ----- His TB test is indeterminate due to steroid use, recommend chest x ray to look for any lung lesions  Thanks RV

## 2020-09-27 ENCOUNTER — Telehealth: Payer: Self-pay

## 2020-10-09 ENCOUNTER — Other Ambulatory Visit: Payer: Self-pay

## 2020-10-09 ENCOUNTER — Ambulatory Visit
Admission: RE | Admit: 2020-10-09 | Discharge: 2020-10-09 | Disposition: A | Discharge status: Home / Self Care | Payer: Self-pay | Source: Ambulatory Visit | Attending: Gastroenterology | Admitting: Gastroenterology

## 2020-10-09 ENCOUNTER — Ambulatory Visit
Admission: RE | Admit: 2020-10-09 | Discharge: 2020-10-09 | Disposition: A | Discharge status: Home / Self Care | Payer: Self-pay | Attending: Gastroenterology | Admitting: Gastroenterology

## 2020-10-09 DIAGNOSIS — Z111 Encounter for screening for respiratory tuberculosis: Secondary | ICD-10-CM | POA: Insufficient documentation

## 2020-10-15 ENCOUNTER — Encounter: Payer: Self-pay | Admitting: Gastroenterology

## 2020-10-15 ENCOUNTER — Ambulatory Visit (INDEPENDENT_AMBULATORY_CARE_PROVIDER_SITE_OTHER): Payer: Self-pay | Admitting: Gastroenterology

## 2020-10-15 ENCOUNTER — Other Ambulatory Visit: Payer: Self-pay

## 2020-10-15 VITALS — BP 110/70 | HR 76 | Ht 74.0 in | Wt 160.5 lb

## 2020-10-15 DIAGNOSIS — K50113 Crohn's disease of large intestine with fistula: Secondary | ICD-10-CM

## 2020-10-15 DIAGNOSIS — K50913 Crohn's disease, unspecified, with fistula: Secondary | ICD-10-CM | POA: Insufficient documentation

## 2020-10-15 DIAGNOSIS — K5010 Crohn's disease of large intestine without complications: Secondary | ICD-10-CM

## 2020-10-15 NOTE — Progress Notes (Signed)
Arlyss Repress, MD [address]  [address]  [place], [place] [address]  Main: [phone]  Fax: [phone]    Gastroenterology Consultation  Referring Provider:     No ref. provider found Primary Care Physician:  Patient, No Pcp Per Primary Gastroenterologist:  Dr. Arlyss Repress Reason for Consultation:     Crohn's colitis and perianal fistula        HPI:   Albert Fuentes is a 30 y.o. male referred by Dr. Patient, No Pcp Per  for consultation & management of new diagnosis of Crohn's colitis, left colon, perianal fistula.  Patient was admitted to [facility] on 09/21/2020 secondary to rectal bleeding and rectal pain.  He had 1 year history of loose stools mixed with blood as well as severe rectal pain, fistula and drainage.  During the hospital stay, he underwent CT abdomen and pelvis with contrast which revealed perianal fistula as well as colitis in the left colon.  Subsequently, underwent colonoscopy which revealed severe inflammation in the left colon compared to right and presence of perianal fistula.  Stool studies were negative for infection including C. difficile.  He had elevated fecal calprotectin levels 563, found to have anemia.  He started on steroids as well as antibiotics.  Patient was discharged home on Cipro and Flagyl as well as prednisone.  Patient has a strong smoking history, 1 pack/day  Interval summary Since discharge, patient reports that he has been doing fairly well, reports having 2 soft bowel movements, nonbloody, denies any abdominal pain, reports minimal drainage from perianal fistula, denies any rectal pain.  He continues to take Cipro and Flagyl.  He decreased prednisone to 20 mg after being 3 days on 40 mg as he noticed bilateral swelling of feet.  He has mild swelling of both ankles.  He denies any leg pain, throbbing or swelling of the legs. 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 - The examined portion of the ileum was normal. Biopsied. - Erythematous mucosa in the ascending colon. - Normal mucosa in the transverse colon. Biopsied. - Congested mucosa in the rectum, in the sigmoid colon and in the descending colon. - The examination was otherwise normal. - Biopsies performed in the descending colon, in the sigmoid colon and in the rectum.  DIAGNOSIS:  A. TERMINAL ILEUM; COLD BIOPSY:  - ENTERIC MUCOSA DISPLAYING NORMAL VILLOUS ARCHITECTURE AND REACTIVE  LYMPHOID HYPERPLASIA.  - NEGATIVE FOR ACTIVE ILEITIS.  - NEGATIVE FOR GRANULOMA, DYSPLASIA, AND MALIGNANCY.   B. COLON, ASCENDING; COLD BIOPSY: BENIGN COLONIC MUCOSA WITH NO  SIGNIFICANT HISTOPATHOLOGIC CHANGE.  - NEGATIVE FOR ACTIVE MUCOSAL COLITIS AND ARCHITECTURAL CHANGES OF  CHRONIC COLITIS.  - NEGATIVE FOR DYSPLASIA AND MALIGNANCY.   C. COLON, TRANSVERSE; COLD BIOPSY:  - NEGATIVE FOR ACTIVE MUCOSAL COLITIS AND ARCHITECTURAL CHANGES OF  CHRONIC COLITIS.  - NEGATIVE FOR GRANULOMA, DYSPLASIA, AND MALIGNANCY.   D. COLON, DESCENDING; COLD BIOPSY:  - CHRONIC COLITIS WITH SEVERE ACTIVITY (CRYPTITIS, CRYPT ABSCESSES, AND  ULCERATION).  - NEGATIVE FOR GRANULOMA, DYSPLASIA, AND MALIGNANCY.   E. COLON, SIGMOID; COLD BIOPSY:  - CHRONIC COLITIS WITH SEVERE ACTIVITY (CRYPTITIS, CRYPT ABSCESSES, AND  ULCERATION).  - NEGATIVE FOR GRANULOMA, DYSPLASIA, AND MALIGNANCY.   F. RECTUM; COLD BIOPSY:  - CHRONIC PROCTITIS WITH MILD ACTIVITY (CRYPTITIS).  - NEGATIVE FOR GRANULOMA, DYSPLASIA,  AND MALIGNANCY.   Comment:  A single non-necrotizing granuloma is identified in the ascending colon  (part B), although architectural changes of chronic colitis and active  mucosal inflammation are not present.   Taken together, the histologic features in the descending colon, sigmoid  colon, and  rectum (parts D-F) are compatible with the clinical  impression of evolving inflammatory bowel disease.   Past Medical History:  Diagnosis Date  . Spontaneous pneumothorax     Past Surgical History:  Procedure Laterality Date  . COLONOSCOPY N/A 09/22/2020   Procedure: COLONOSCOPY;  Surgeon: Toledo, Boykin Nearing, MD;  Location: [facility] ENDOSCOPY;  Service: Gastroenterology;  Laterality: N/A;  . LUNG SURGERY      Current Outpatient Medications:  .  ciprofloxacin (CIPRO) 500 MG tablet, Take 1 tablet (500 mg total) by mouth 2 (two) times daily., Disp: 60 tablet, Rfl: 0 .  metroNIDAZOLE (FLAGYL) 500 MG tablet, Take 1 tablet (500 mg total) by mouth every 12 (twelve) hours., Disp: 60 tablet, Rfl: 0 .  predniSONE (DELTASONE) 20 MG tablet, Take 2 tablets (40 mg total) by mouth daily., Disp: 60 tablet, Rfl: 0 .  zinc oxide (BALMEX) 11.3 % CREA cream, Apply 1 application topically 2 (two) times daily for 56 doses., Disp: 56 g, Rfl: 0   Family History  Problem Relation Age of Onset  . Crohn's disease Mother      Social History   Tobacco Use  . Smoking status: Current Every Day Smoker    Packs/day: 1.00    Years: 5.00    Pack years: 5.00  . Smokeless tobacco: Current User  Vaping Use  . Vaping Use: Never used  Substance Use Topics  . Alcohol use: Yes    Alcohol/week: 7.0 standard drinks    Types: 5 Cans of beer, 2 Shots of liquor per week    Comment: down to 1-2 a month   . Drug use: Yes    Types: Marijuana    Allergies as of 10/15/2020  . (No Known Allergies)    Review of Systems:    All systems reviewed and negative except where noted in HPI.   Physical Exam:  BP 110/70 (BP Location: Left Arm, Patient Position: Sitting, Cuff Size: Normal)   Pulse 76   Ht 6\' 2"  (1.88 m)   Wt 160 lb 8 oz (72.8 kg)   BMI 20.61 kg/m  No LMP for male patient.  General:   Alert, moderately built, moderately nourished, pleasant and cooperative in NAD Head:  Normocephalic and atraumatic. Eyes:   Sclera clear, no icterus.   Conjunctiva pink. Ears:  Normal auditory acuity. Nose:  No deformity, discharge, or lesions. Mouth:  No deformity or lesions,oropharynx pink & moist. Neck:  Supple; no masses or thyromegaly. Lungs:  Respirations even and unlabored.  Clear throughout to auscultation.   No wheezes, crackles, or rhonchi. No acute distress. Heart:  Regular rate and rhythm; no murmurs, clicks, rubs, or gallops. Abdomen:  Normal bowel sounds. Soft, non-tender and non-distended without masses, hepatosplenomegaly or hernias noted.  No guarding or rebound tenderness.   Rectal: Not performed Msk:  Symmetrical without gross deformities. Good, equal movement & strength bilaterally. Pulses:  Normal pulses noted. Extremities:  No clubbing or edema.  No cyanosis. Neurologic:  Alert and oriented x3;  grossly normal neurologically. Skin:  Intact without significant lesions or rashes. No jaundice. Lymph Nodes:  No significant cervical adenopathy. Psych:  Alert and cooperative. Normal mood and affect.  Imaging Studies: Reviewed  Assessment and Plan:  Albert Fuentes is a 30 y.o. male with Crohn's colitis and perianal fistula diagnosed in 09/2020, biopsy-proven, presence of noncaseating granuloma currently on prednisone as well as Cipro and Flagyl  Crohn's colitis and perianal fistula Patient will need long-term treatment with biologic Patient currently does not have insurance, he will apply for Humira assistance program Continue prednisone 20 mg daily Continue ciprofloxacin and Flagyl 500 mg twice daily Hepatitis a and B serologies are negative QuantiFERON gold was indeterminate on prednisone, chest x-ray did not reveal any lung lesions Recheck CBC today, iron studies are normal, B12 and folate levels are normal  Follow up in 2 months   Arlyss Repress, MD

## 2020-11-13 ENCOUNTER — Ambulatory Visit: Payer: Self-pay | Admitting: Gastroenterology

## 2020-12-17 ENCOUNTER — Ambulatory Visit: Payer: Self-pay | Admitting: Gastroenterology

## 2021-01-24 ENCOUNTER — Telehealth: Payer: Self-pay | Admitting: Pharmacy Technician

## 2021-01-24 NOTE — Telephone Encounter (Signed)
Patient failed to provide 2022 proof of income.  No additional medication assistance will be provided by [facility] without the required proof of income documentation.  Patient notified by letter.  Sherilyn Dacosta Care Manager [facility]    [address] [place], [place]  [address]  January 24, 2021    Jalene Lacko [address] [place], [place]  [address]  Dear Viviann Spare:  This is to inform you that you are no longer eligible to receive medication assistance at [facility].  The reason(s) are:    _____Your total gross monthly household income exceeds 250% of the Federal Poverty Level.   _____Tangible assets (savings, checking, stocks/bonds, pension, retirement, etc.) exceeds our limit  _____You are eligible to receive benefits from [facility], [facility] or HIV Medication              Assistance Program _____You are eligible to receive benefits from a Medicare Part "D" plan _____You have prescription insurance  _____You are not an [facility] resident __X__Failure to provide all requested documentation (proof of income for 2022, and/or Patient Intake Application, DOH Attestation, Contract, etc).    Medication assistance will resume once all requested documentation has been returned to our clinic.  If you have questions, please contact our clinic at [phone].    Thank you,  [facility]

## 2021-01-30 ENCOUNTER — Other Ambulatory Visit: Payer: Self-pay

## 2021-05-09 IMAGING — DX DG FOOT COMPLETE 3+V*R*
3 series · 3 of 3 positions shown · non-contrast
Comparison: None.

CLINICAL DATA: Right foot pain after fall yesterday.

EXAM:
RIGHT FOOT COMPLETE - 3+ VIEW

[foot ap]
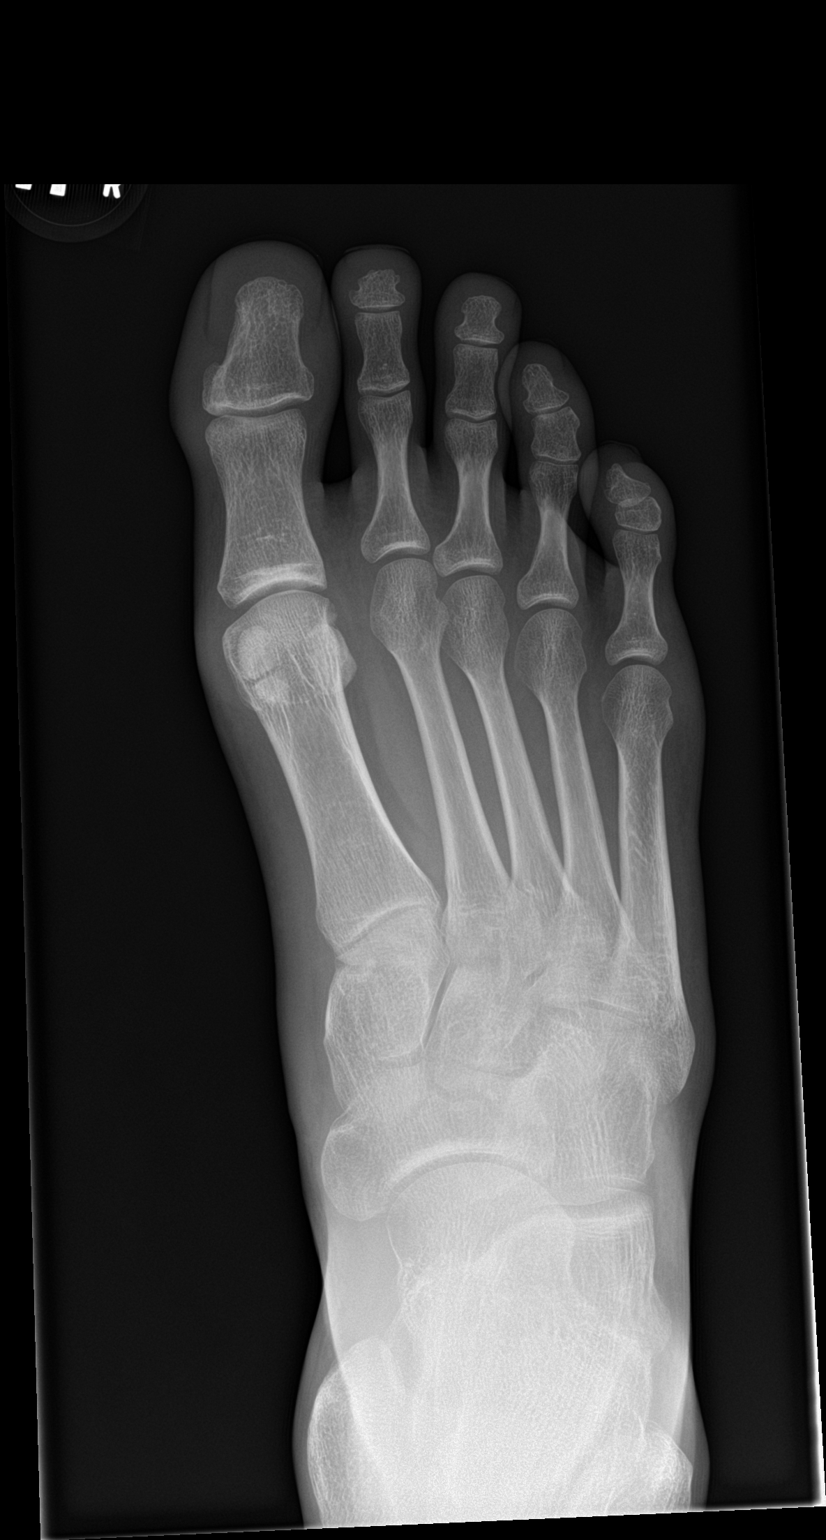

[foot obl]
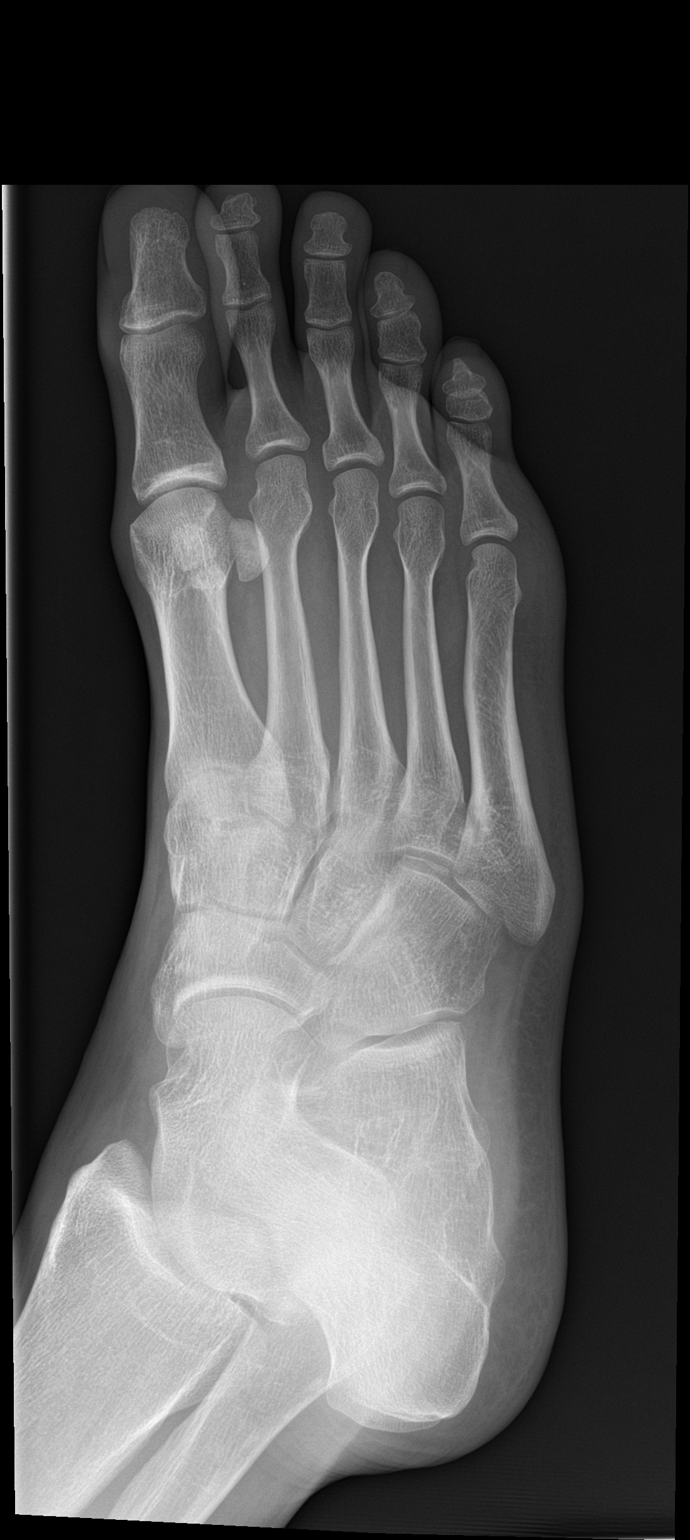

[foot lat]
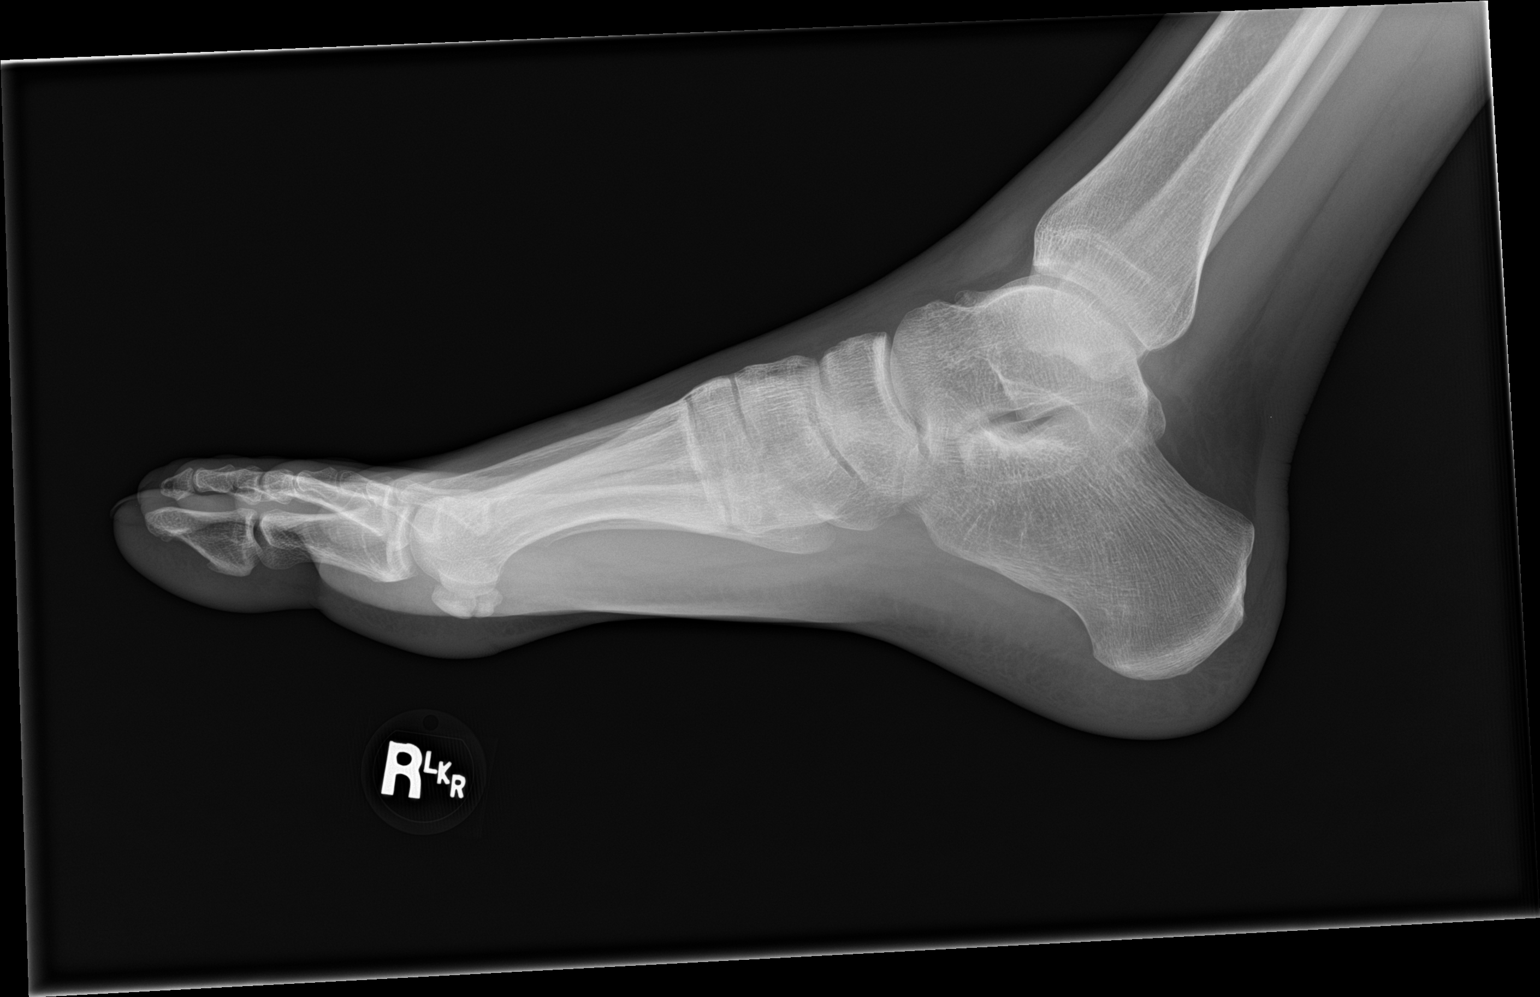

[3 of 3 positions shown; findings below may reference images not displayed]

FINDINGS: There is no evidence of fracture or dislocation. There is no
evidence of arthropathy or other focal bone abnormality. Soft
tissues are unremarkable.
IMPRESSION: Negative.

## 2021-06-06 NOTE — Telephone Encounter (Signed)
NA

## 2021-07-17 ENCOUNTER — Other Ambulatory Visit: Payer: Self-pay

## 2021-07-17 ENCOUNTER — Emergency Department: Payer: Self-pay

## 2021-07-17 ENCOUNTER — Emergency Department
Admission: EM | Admit: 2021-07-17 | Discharge: 2021-07-17 | Disposition: A | Discharge status: Home / Self Care | Payer: Self-pay | Attending: Emergency Medicine | Admitting: Emergency Medicine

## 2021-07-17 DIAGNOSIS — F17210 Nicotine dependence, cigarettes, uncomplicated: Secondary | ICD-10-CM | POA: Insufficient documentation

## 2021-07-17 DIAGNOSIS — K50913 Crohn's disease, unspecified, with fistula: Secondary | ICD-10-CM | POA: Insufficient documentation

## 2021-07-17 LAB — COMPREHENSIVE METABOLIC PANEL
ALT: 32 U/L (ref 0–44)
AST: 28 U/L (ref 15–41)
Albumin: 3.7 g/dL (ref 3.5–5.0)
Alkaline Phosphatase: 82 U/L (ref 38–126)
Anion gap: 6 (ref 5–15)
BUN: 11 mg/dL (ref 6–20)
CO2: 28 mmol/L (ref 22–32)
Calcium: 8.9 mg/dL (ref 8.9–10.3)
Chloride: 101 mmol/L (ref 98–111)
Creatinine, Ser: 0.89 mg/dL (ref 0.61–1.24)
GFR, Estimated: >60 mL/min (ref >60)
Glucose, Bld: 85 mg/dL (ref 70–99)
Potassium: 3.6 mmol/L (ref 3.5–5.1)
Sodium: 135 mmol/L (ref 135–145)
Total Bilirubin: 0.4 mg/dL (ref 0.3–1.2)
Total Protein: 8.2 g/dL — ABNORMAL HIGH (ref 6.5–8.1)

## 2021-07-17 LAB — CBC WITH DIFFERENTIAL/PLATELET
Abs Immature Granulocytes: 0.02 10*3/uL (ref 0.00–0.07)
Basophils Absolute: 0.1 10*3/uL (ref 0.0–0.1)
Basophils Relative: 1 %
Eosinophils Absolute: 0.2 10*3/uL (ref 0.0–0.5)
Eosinophils Relative: 2 %
HCT: 40.7 % (ref 39.0–52.0)
Hemoglobin: 12.9 g/dL — ABNORMAL LOW (ref 13.0–17.0)
Immature Granulocytes: 0 %
Lymphocytes Relative: 30 %
Lymphs Abs: 2.7 10*3/uL (ref 0.7–4.0)
MCH: 24.8 pg — ABNORMAL LOW (ref 26.0–34.0)
MCHC: 31.7 g/dL (ref 30.0–36.0)
MCV: 78.1 fL — ABNORMAL LOW (ref 80.0–100.0)
Monocytes Absolute: 0.7 10*3/uL (ref 0.1–1.0)
Monocytes Relative: 8 %
Neutro Abs: 5.2 10*3/uL (ref 1.7–7.7)
Neutrophils Relative %: 59 %
Platelets: 285 10*3/uL (ref 150–400)
RBC: 5.21 MIL/uL (ref 4.22–5.81)
RDW: 17.2 % — ABNORMAL HIGH (ref 11.5–15.5)
WBC: 8.8 10*3/uL (ref 4.0–10.5)
nRBC: 0 % (ref 0.0–0.2)

## 2021-07-17 MED ORDER — METRONIDAZOLE 500 MG PO TABS: 500.0000 mg | ORAL_TABLET | Freq: Two times a day (BID) | ORAL | 0 refills | Status: AC

## 2021-07-17 MED ORDER — IOHEXOL 300 MG/ML  SOLN
100.0000 mL | Freq: Once | INTRAMUSCULAR | Status: AC | PRN
  Administered 2021-07-17: 19:00:00 100 mL via INTRAVENOUS
  Filled 2021-07-17: qty 100

## 2021-07-17 NOTE — ED Triage Notes (Signed)
Pt comes with c/o rectal pain. Pt states this started this past weekend. Pt states he thinks it is anal fistula. Pt states it is draining.

## 2021-07-17 NOTE — ED Notes (Signed)
ED Provider at bedside. 

## 2021-07-17 NOTE — ED Notes (Signed)
This RN and MD at bedside for rectal exam.

## 2021-07-17 NOTE — Discharge Instructions (Addendum)
I am going to give you Flagyl the antibiotic.  Take 1 twice a day for 2 weeks.  Dr. Norma Fredrickson wants to follow-up with you.  I have included his name and phone number for you.  He has seen you in the past and done a colonoscopy on you.  Dr. Norma Fredrickson feels giving you steroids would only make the fistula stay open longer.  I want you to try to follow-up with one of the clinics that we have here that will see you for reduced cost.  That can get your primary care doctor and get your referrals for other specialty care if need be.  These clinics include the [facility] and the [facility] clinic in the [facility] in the [facility].  There is also [place] that sometimes can help and [facility] has a charity care clinic.  You may qualify for 1 of these.  Additionally I would like you to call the main hospital number which is [phone].  Asked them to connect you to social work.  The social worker may be able to help you do this and also help you get the Humira through the company help program.  They are experts of doing this for people and situations similar to yours and they may be able to help with that.  Please return here for any fever or vomiting or worsening pain or swelling or any other problems.  Use Tylenol or Advil if needed for pain.  I do not want to give you anything stronger because that may hide worsening if it begins to occur.

## 2021-07-17 NOTE — ED Provider Notes (Signed)
[facility] Emergency Department Provider Note   ____________________________________________   Event Date/Time   First MD Initiated Contact with Patient 07/17/21 1801     (approximate)  I have reviewed the triage vital signs and the nursing notes.   HISTORY  Chief Complaint Hemorrhoids   HPI Albert Fuentes is a 30 y.o. male who has a history of Crohn's disease.  He also has a anal fistula.  This is a known problem.  Its been filling up and emptying a lot lately.  It is irritating the skin of his perineum.  He also reports is making him smell at work.  It hurts as well.  He is coming in to be checked.  He has not seen anyone for this problem since early this year.         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 Authorizing Provider  metroNIDAZOLE (FLAGYL) 500 MG tablet Take 1 tablet (500 mg total) by mouth 2 (two) times daily for 14 days. 07/17/21 07/31/21 Yes Arnaldo Natal, MD  ciprofloxacin (CIPRO) 500 MG tablet TAKE ONE TABLET BY MOUTH 2 TIMES A DAY 09/25/20 09/25/21  Lynn Ito, MD  metroNIDAZOLE (FLAGYL) 500 MG tablet TAKE ONE TABLET BY MOUTH EVERY 12 HOURS 09/25/20 09/25/21  Lynn Ito, MD  pramoxine-hydrocortisone 1-1 % foam APPLY TOPICALLY 2 TIMES A DAY FOR 19 DAYS. SHAKE WELL FOR 5-10 SECONDS BEFORE EACH USE. RINSE  CAP AND CONTAINER WITH WARM WATER AFTER EACH USE. 09/25/20 09/25/21  Lynn Ito, MD  predniSONE (DELTASONE) 20 MG tablet TAKE TWO TABLETS BY MOUTH EVERY DAY 09/25/20 09/25/21  Lynn Ito, MD    Allergies Patient has no known allergies.  Family History  Problem Relation Age of Onset   Crohn's disease Mother     Social History Social History   Tobacco Use   Smoking status: Every Day    Packs/day: 1.00    Years: 5.00    Pack years: 5.00    Types: Cigarettes   Smokeless tobacco: Current  Vaping Use   Vaping Use: Never used  Substance Use Topics   Alcohol use: Yes    Alcohol/week: 7.0 standard drinks    Types: 5 Cans of beer, 2 Shots of liquor per week    Comment: down to 1-2 a month    Drug use: Yes    Types: Marijuana    Review of Systems  Constitutional: No fever/chills Eyes: No visual changes. ENT: No sore throat. Cardiovascular: Denies chest pain. Respiratory: Denies shortness of breath. Gastrointestinal: No abdominal pain.  No nausea, no vomiting.  No diarrhea.  No constipation.  He does have perineal pain. Genitourinary: Negative for dysuria. Musculoskeletal: Negative for back pain. Skin: Negative for rash. Neurological: Negative for headaches, focal weakness   ____________________________________________   PHYSICAL EXAM:  VITAL SIGNS: ED Triage Vitals  Enc Vitals Group     BP 07/17/21 1523 128/84     Pulse Rate 07/17/21 1523 (!) 102     Resp 07/17/21 1523 18     Temp 07/17/21 1523 98 F (36.7 C)     Temp src --      SpO2 07/17/21 1523 98 %     Weight --      Height --      Head Circumference --      Peak Flow --      Pain Score 07/17/21 1523 6     Pain Loc --      Pain Edu? --      Excl. in [place]? --     Constitutional: Alert and oriented. Well appearing and in no acute distress. Eyes: Conjunctivae are normal Head: Atraumatic. Nose: No congestion/rhinnorhea. Mouth/Throat: Mucous membranes are moist.  Oropharynx non-erythematous. Neck: No  stridor. Cardiovascular: Normal rate, regular rhythm. Grossly normal heart sounds.  Good peripheral circulation. Respiratory: Normal respiratory effort.  No retractions. Lungs CTAB. Gastrointestinal: Soft and nontender. No distention. No abdominal bruits.  Musculoskeletal: No lower extremity tenderness nor edema.  No joint effusions. GU/rectal: Patient has a normal-appearing anus and rectal perirectal area.  However on the perineum between the scrotum and the rectum there are several areas of firmness and redness that are tender.  He has a fistula that has been draining that is obvious.  This is worrisome for infection/cellulitis/abscess developing. Neurologic:  Normal speech and language. No gross focal neurologic deficits are appreciated.  Skin:  Skin is warm, dry and intact. No rash noted.   ____________________________________________   LABS (all labs ordered are listed, but only abnormal results are displayed)  Labs Reviewed  COMPREHENSIVE METABOLIC PANEL - Abnormal; Notable for the following components:      Result Value   Total Protein 8.2 (*)    All other components within normal limits  CBC WITH DIFFERENTIAL/PLATELET - Abnormal; Notable for the following components:   Hemoglobin 12.9 (*)    MCV 78.1 (*)    MCH 24.8 (*)    RDW 17.2 (*)    All other components within normal limits   ____________________________________________  EKG   ____________________________________________  RADIOLOGY Gertha Calkin, personally viewed and evaluated these images (plain radiographs) as part of my medical decision making, as well as reviewing the written report by the radiologist.  ED MD interpretation:    Official radiology report(s): CT PELVIS W CONTRAST  Result Date: 07/17/2021 CLINICAL DATA:  Rectal pain.  History of Crohn's disease. EXAM: CT PELVIS WITH CONTRAST TECHNIQUE: Multidetector CT imaging of the pelvis was performed using the standard protocol following the bolus  administration of intravenous contrast. CONTRAST:  112mL OMNIPAQUE IOHEXOL 300 MG/ML  SOLN COMPARISON:  September 21, 2020. FINDINGS: Urinary Tract:  No abnormality visualized. Bowel: There is no evidence of bowel obstruction. There appears to be mild rectal wall thickening concerning for proctitis. Left-sided perianal fistula is again noted contains some gas. Vascular/Lymphatic: No pathologically enlarged lymph nodes. No significant vascular abnormality seen. Reproductive:  Prostate is unremarkable. Other: There does appear to be subcutaneous thickening involving the perineum bilaterally consistent with inflammation, but defined fluid collection or abscess is not clearly visualized. Musculoskeletal: No suspicious bone lesions identified. IMPRESSION: Mild rectal wall thickening is noted concerning for proctitis. Left-sided perianal fistula is again noted which contains some gas. Subcutaneous thickening is seen involving the perineum bilaterally consistent with inflammation, but defined fluid collection or abscess is not  clearly visualized. Electronically Signed   By: Marijo Conception M.D.   On: 07/17/2021 19:33    ____________________________________________   PROCEDURES  Procedure(s) performed (including Critical Care):  Procedures   ____________________________________________   INITIAL IMPRESSION / ASSESSMENT AND PLAN / ED COURSE  ----------------------------------------- 9:08 PM on 07/17/2021 ----------------------------------------- Discussed patient with Dr. Alice Reichert GI.  Dr. Alice Reichert feels that we should give him Flagyl 500 twice daily for 2 weeks.  He thinks prednisone will serve to keep the fistula open longer.  He wants to see the patient in a week or 2.  We will have the patient come back here if he is worse.  I will have him call the social worker at the hospital see if she can help get him any insurance or 1 of those [place] [employer] that works or possibly he will be eligible for state aid  somehow.  I have provided him a list of the reduced cost clinics in the area and reminded him about [facility].  He will come here if he has any further problems or worsening.  He knows that we do not worry about whether or not he can pay here.  I also discussed with him the need to change the diaper frequently or perhaps even try feminine pad for this.             ____________________________________________   FINAL CLINICAL IMPRESSION(S) / ED DIAGNOSES  Final diagnoses:  Crohn's disease with fistula, unspecified gastrointestinal tract location [facility])     ED Discharge Orders          Ordered    metroNIDAZOLE (FLAGYL) 500 MG tablet  2 times daily        07/17/21 2100             Note:  This document was prepared using Dragon voice recognition software and may include unintentional dictation errors.    Nena Polio, MD 07/17/21 2110

## 2021-12-23 ENCOUNTER — Emergency Department (HOSPITAL_COMMUNITY)
Admission: EM | Admit: 2021-12-23 | Discharge: 2021-12-23 | Disposition: A | Discharge status: Home / Self Care | Payer: Self-pay | Attending: Emergency Medicine | Admitting: Emergency Medicine

## 2021-12-23 ENCOUNTER — Emergency Department (HOSPITAL_COMMUNITY): Payer: Self-pay

## 2021-12-23 ENCOUNTER — Other Ambulatory Visit: Payer: Self-pay

## 2021-12-23 ENCOUNTER — Encounter (HOSPITAL_COMMUNITY): Payer: Self-pay

## 2021-12-23 DIAGNOSIS — R0789 Other chest pain: Secondary | ICD-10-CM | POA: Insufficient documentation

## 2021-12-23 DIAGNOSIS — F17200 Nicotine dependence, unspecified, uncomplicated: Secondary | ICD-10-CM | POA: Insufficient documentation

## 2021-12-23 DIAGNOSIS — R0602 Shortness of breath: Secondary | ICD-10-CM | POA: Insufficient documentation

## 2021-12-23 LAB — CBC WITH DIFFERENTIAL/PLATELET
Abs Immature Granulocytes: 0.05 10*3/uL (ref 0.00–0.07)
Basophils Absolute: 0.1 10*3/uL (ref 0.0–0.1)
Basophils Relative: 1 %
Eosinophils Absolute: 0 10*3/uL (ref 0.0–0.5)
Eosinophils Relative: 0 %
HCT: 44.5 % (ref 39.0–52.0)
Hemoglobin: 14.6 g/dL (ref 13.0–17.0)
Immature Granulocytes: 0 %
Lymphocytes Relative: 11 %
Lymphs Abs: 1.3 10*3/uL (ref 0.7–4.0)
MCH: 25.8 pg — ABNORMAL LOW (ref 26.0–34.0)
MCHC: 32.8 g/dL (ref 30.0–36.0)
MCV: 78.8 fL — ABNORMAL LOW (ref 80.0–100.0)
Monocytes Absolute: 0.5 10*3/uL (ref 0.1–1.0)
Monocytes Relative: 4 %
Neutro Abs: 9.7 10*3/uL — ABNORMAL HIGH (ref 1.7–7.7)
Neutrophils Relative %: 84 %
Platelets: 304 10*3/uL (ref 150–400)
RBC: 5.65 MIL/uL (ref 4.22–5.81)
RDW: 14.4 % (ref 11.5–15.5)
WBC: 11.6 10*3/uL — ABNORMAL HIGH (ref 4.0–10.5)
nRBC: 0 % (ref 0.0–0.2)

## 2021-12-23 LAB — COMPREHENSIVE METABOLIC PANEL
ALT: 13 U/L (ref 0–44)
AST: 23 U/L (ref 15–41)
Albumin: 3.6 g/dL (ref 3.5–5.0)
Alkaline Phosphatase: 81 U/L (ref 38–126)
Anion gap: 10 (ref 5–15)
BUN: 9 mg/dL (ref 6–20)
CO2: 23 mmol/L (ref 22–32)
Calcium: 9.1 mg/dL (ref 8.9–10.3)
Chloride: 103 mmol/L (ref 98–111)
Creatinine, Ser: 1.13 mg/dL (ref 0.61–1.24)
GFR, Estimated: >60 mL/min (ref >60)
Glucose, Bld: 126 mg/dL — ABNORMAL HIGH (ref 70–99)
Potassium: 3.5 mmol/L (ref 3.5–5.1)
Sodium: 136 mmol/L (ref 135–145)
Total Bilirubin: 0.6 mg/dL (ref 0.3–1.2)
Total Protein: 7.7 g/dL (ref 6.5–8.1)

## 2021-12-23 LAB — D-DIMER, QUANTITATIVE: D-Dimer, Quant: 1.52 ug/mL-FEU — ABNORMAL HIGH (ref 0.00–0.50)

## 2021-12-23 MED ORDER — ALBUTEROL SULFATE HFA 108 (90 BASE) MCG/ACT IN AERS
2.0000 | INHALATION_SPRAY | Freq: Once | RESPIRATORY_TRACT | Status: AC
  Administered 2021-12-23: 2 via RESPIRATORY_TRACT
  Filled 2021-12-23: qty 6.7

## 2021-12-23 MED ORDER — LACTATED RINGERS IV BOLUS
1000.0000 mL | Freq: Once | INTRAVENOUS | Status: AC
  Administered 2021-12-23: 1000 mL via INTRAVENOUS

## 2021-12-23 MED ORDER — LORAZEPAM 2 MG/ML IJ SOLN
0.5000 mg | Freq: Once | INTRAMUSCULAR | Status: DC
  Filled 2021-12-23: qty 1

## 2021-12-23 MED ORDER — IOHEXOL 350 MG/ML SOLN
75.0000 mL | Freq: Once | INTRAVENOUS | Status: AC | PRN
  Administered 2021-12-23: 75 mL via INTRAVENOUS

## 2021-12-23 NOTE — ED Provider Notes (Signed)
[facility] EMERGENCY DEPARTMENT Provider Note   CSN: [id number] Arrival date & time: 12/23/21  5400     History  Chief Complaint  Patient presents with   Shortness of Breath    Albert Fuentes is a 31 y.o. male.  31 yo M w/ h/o Crohns and pneumothorax presents to the ED with left sided chest discomfort. Feels like slight pain with deep breathing but more feels like he cant get a deep breath.    Shortness of Breath     Home Medications Prior to Admission medications   Not on File      Allergies    Patient has no known allergies.    Review of Systems   Review of Systems  Respiratory:  Positive for shortness of breath.    Physical Exam Updated Vital Signs BP 117/76   Pulse 100   Temp 98.1 F (36.7 C) (Oral)   Resp 14   Ht 6\' 2"  (1.88 m)   Wt 72.6 kg   SpO2 99%   BMI 20.54 kg/m  Physical Exam Vitals and nursing note reviewed.  Constitutional:      Appearance: He is well-developed.  HENT:     Head: Normocephalic and atraumatic.  Cardiovascular:     Rate and Rhythm: Normal rate.  Pulmonary:     Effort: Pulmonary effort is normal. No respiratory distress.     Breath sounds: No decreased breath sounds or wheezing.  Abdominal:     General: There is no distension.  Musculoskeletal:        General: Normal range of motion.     Cervical back: Normal range of motion.  Skin:    General: Skin is warm and dry.  Neurological:     General: No focal deficit present.     Mental Status: He is alert.    ED Results / Procedures / Treatments   Labs (all labs ordered are listed, but only abnormal results are displayed) Labs Reviewed  CBC WITH DIFFERENTIAL/PLATELET - Abnormal; Notable for the following components:      Result Value   WBC 11.6 (*)    MCV 78.8 (*)    MCH 25.8 (*)    Neutro Abs 9.7 (*)    All other components within normal limits  COMPREHENSIVE METABOLIC PANEL - Abnormal; Notable for the following components:   Glucose, Bld 126  (*)    All other components within normal limits  D-DIMER, QUANTITATIVE - Abnormal; Notable for the following components:   D-Dimer, Quant 1.52 (*)    All other components within normal limits    EKG EKG Interpretation  Date/Time:  Monday Dec 23 2021 03:38:12 EDT Ventricular Rate:  113 PR Interval:  189 QRS Duration: 87 QT Interval:  300 QTC Calculation: 412 R Axis:   86 Text Interpretation: Sinus tachycardia Biatrial enlargement ST elev, probable normal early repol pattern Confirmed by 12-24-1969 [phone]) on 12/23/2021 3:40:14 AM  Radiology DG Chest 2 View  Result Date: 12/23/2021 CLINICAL DATA:  31 year old male with shortness of breath.  Smoker. EXAM: CHEST - 2 VIEW COMPARISON:  Chest radiographs 10/09/2020 and earlier. FINDINGS: Lung volumes and mediastinal contours are stable since last year, within normal limits. Visualized tracheal air column is within normal limits. Mild increased perihilar interstitial lung markings appear stable and are likely smoking related. No pneumothorax, pulmonary edema, pleural effusion or acute pulmonary opacity. Negative visible bowel gas and osseous structures. IMPRESSION: No acute cardiopulmonary abnormality. Electronically Signed   By: 12/09/2020  M.D.   On: 12/23/2021 04:40   CT Angio Chest PE W and/or Wo Contrast  Result Date: 12/23/2021 CLINICAL DATA:  31 year old male with shortness of breath. History of spontaneous pneumothorax. Smoker. EXAM: CT ANGIOGRAPHY CHEST WITH CONTRAST TECHNIQUE: Multidetector CT imaging of the chest was performed using the standard protocol during bolus administration of intravenous contrast. Multiplanar CT image reconstructions and MIPs were obtained to evaluate the vascular anatomy. RADIATION DOSE REDUCTION: This exam was performed according to the departmental dose-optimization program which includes automated exposure control, adjustment of the mA and/or kV according to patient size and/or use of iterative reconstruction  technique. CONTRAST:  103mL OMNIPAQUE IOHEXOL 350 MG/ML SOLN COMPARISON:  Chest radiographs 0400 hours today. CT Abdomen and Pelvis 09/21/2020. FINDINGS: Cardiovascular: Good contrast bolus timing in the pulmonary arterial tree. No focal filling defect identified in the pulmonary arteries to suggest acute pulmonary embolism. No calcified coronary artery atherosclerosis is evident. No cardiomegaly or pericardial effusion. Negative visible aorta. Mediastinum/Nodes: No mediastinal mass or lymphadenopathy. Bilateral hilar lymph nodes are at the upper limits of normal, appear reactive. Lungs/Pleura: Major airways are patent. There is subtle but generalized centrilobular ground-glass opacity throughout both lungs (note rounded area of sparing in the lateral basal segment right lower lobe series 6, image 110, perhaps due to focal gas trapping). Minimal superimposed dependent atelectasis. No pleural effusion or solid pulmonary opacity. Upper Abdomen: Negative visible liver, spleen, gallbladder, pancreas, adrenal glands, kidneys, and bowel in the upper abdomen. Musculoskeletal: Mild chronic appearing T1 superior endplate compression. No acute osseous abnormality identified. Review of the MIP images confirms the above findings. IMPRESSION: 1. Negative for acute pulmonary embolus. 2. Subtle generalized centrilobular ground-glass opacity throughout both lungs. Mild central bronchial wall thickening. Consider Smoking Related Respiratory Bronchiolitis. Reactive appearing bilateral hilar lymph nodes. No pleural effusion or solid pulmonary opacity. Electronically Signed   By: Odessa Fleming M.D.   On: 12/23/2021 06:45    Procedures Procedures    Medications Ordered in ED Medications  LORazepam (ATIVAN) injection 0.5 mg (0.5 mg Intravenous Not Given 12/23/21 0437)  lactated ringers bolus 1,000 mL (0 mLs Intravenous Stopped 12/23/21 0540)  albuterol (VENTOLIN HFA) 108 (90 Base) MCG/ACT inhaler 2 puff (2 puffs Inhalation Given 12/23/21  0644)  iohexol (OMNIPAQUE) 350 MG/ML injection 75 mL (75 mLs Intravenous Contrast Given 12/23/21 0620)    ED Course/ Medical Decision Making/ A&P                           Medical Decision Making Amount and/or Complexity of Data Reviewed Labs: ordered. Radiology: ordered.  Risk Prescription drug management.   Rule out PE. No pneumonia. No PTX. Bronchoconstriction? Anxiety?   Final Clinical Impression(s) / ED Diagnoses Final diagnoses:  Shortness of breath    Rx / DC Orders ED Discharge Orders     None         Jenniferlynn Saad, Barbara Cower, MD 12/23/21 [phone]

## 2021-12-23 NOTE — ED Triage Notes (Signed)
Patient with history of spontaneous pneumo. Patient reports as if he can't take a deep breath on the left side that started today while at work.

## 2022-07-21 ENCOUNTER — Emergency Department: Payer: Self-pay

## 2022-07-21 ENCOUNTER — Encounter: Payer: Self-pay | Admitting: Emergency Medicine

## 2022-07-21 ENCOUNTER — Other Ambulatory Visit: Payer: Self-pay

## 2022-07-21 ENCOUNTER — Emergency Department
Admission: EM | Admit: 2022-07-21 | Discharge: 2022-07-21 | Disposition: A | Discharge status: Home / Self Care | Payer: Self-pay | Attending: Emergency Medicine | Admitting: Emergency Medicine

## 2022-07-21 DIAGNOSIS — M25561 Pain in right knee: Secondary | ICD-10-CM | POA: Insufficient documentation

## 2022-07-21 MED ORDER — MELOXICAM 15 MG PO TABS: 15.0000 mg | ORAL_TABLET | Freq: Every day | ORAL | 0 refills | Status: AC

## 2022-07-21 NOTE — ED Provider Triage Note (Signed)
Emergency Medicine Provider Triage Evaluation Note  Albert Fuentes , a 31 y.o. male  was evaluated in triage.  Pt complains of nontraumatic knee pain.  Review of Systems  Positive: Knee pain and swelling Negative: Fever, chills  Physical Exam  BP 137/88   Pulse (!) 102   Temp 99.3 F (37.4 C)   Resp 16   Ht 6\' 3"  (1.905 m)   Wt 77.1 kg   BMI 21.25 kg/m  Gen:   Awake, no distress   Resp:  Normal effort  MSK:   Moves extremities without difficulty. Edematous knee Other:    Medical Decision Making  Medically screening exam initiated at 5:04 PM.  Appropriate orders placed.  Albert Fuentes was informed that the remainder of the evaluation will be completed by another provider, this initial triage assessment does not replace that evaluation, and the importance of remaining in the ED until their evaluation is complete.  Xray. May need arthrocentesis in the room   Windy Carina, Racheal Patches 07/21/22 1704

## 2022-07-21 NOTE — ED Provider Notes (Signed)
[facility] Provider Note  Patient Contact: 6:58 PM (approximate)   History   Knee Pain   HPI  Albert Fuentes is a 31 y.o. male presents to the emergency department with acute right knee pain the past 2 to 4 days.  Patient denies any falls or other mechanisms of trauma.  No fever or chills.  Patient states that he has pain with bearing weight but is still ambulating.  No similar complaints in the past.      Physical Exam   Triage Vital Signs: ED Triage Vitals [07/21/22 1654]  Enc Vitals Group     BP 137/88     Pulse Rate (!) 102     Resp 16     Temp 99.3 F (37.4 C)     Temp src      SpO2      Weight 170 lb (77.1 kg)     Height 6\' 3"  (1.905 m)     Head Circumference      Peak Flow      Pain Score      Pain Loc      Pain Edu?      Excl. in [place]?     Most recent vital signs: Vitals:   07/21/22 1654  BP: 137/88  Pulse: (!) 102  Resp: 16  Temp: 99.3 F (37.4 C)     General: Alert and in no acute distress. Eyes:  PERRL. EOMI. Head: No acute traumatic findings ENT:      Nose: No congestion/rhinnorhea.      Mouth/Throat: Mucous membranes are moist.  Neck: No stridor. No cervical spine tenderness to palpation. Cardiovascular:  Good peripheral perfusion Respiratory: Normal respiratory effort without tachypnea or retractions. Lungs CTAB. Good air entry to the bases with no decreased or absent breath sounds. Gastrointestinal: Bowel sounds 4 quadrants. Soft and nontender to palpation. No guarding or rigidity. No palpable masses. No distention. No CVA tenderness.* Musculoskeletal: Patient performs full range of motion at the right knee.  Patient does have loss of peripatellar dimpling on the right.  Palpable dorsalis pedis pulse, right.  Capillary refill less than 2 seconds on the right. Neurologic:  No gross focal neurologic deficits are appreciated.  Skin:   No rash noted Other:   ED Results / Procedures / Treatments   Labs (all labs  ordered are listed, but only abnormal results are displayed) Labs Reviewed - No data to display      RADIOLOGY  I personally viewed and evaluated these images as part of my medical decision making, as well as reviewing the written report by the radiologist.  ED Provider Interpretation: Mild joint effusion without fracture.   PROCEDURES:  Critical Care performed: No  Procedures   MEDICATIONS ORDERED IN ED: Medications - No data to display   IMPRESSION / MDM / ASSESSMENT AND PLAN / ED COURSE  I reviewed the triage vital signs and the nursing notes.                              Assessment and plan Knee pain 31 year old male presents to the emergency department with acute right knee pain.  Vital signs are reassuring at triage.  On exam, patient was alert, active and nontoxic-appearing.  Patient had no acute bony abnormality visualized on x-ray of the right knee.  He was discharged with meloxicam.  Offered arthrocentesis but patient declined stating that he would rather follow-up with orthopedics.  Return precautions were given to return with new or worsening symptoms.  All patient questions were answered.      FINAL CLINICAL IMPRESSION(S) / ED DIAGNOSES   Final diagnoses:  Acute pain of right knee     Rx / DC Orders   ED Discharge Orders          Ordered    meloxicam (MOBIC) 15 MG tablet  Daily        07/21/22 1854             Note:  This document was prepared using Dragon voice recognition software and may include unintentional dictation errors.   Pia Mau [place], PA-C 07/21/22 1901    Shaune Pollack, MD 07/22/22 [phone]

## 2022-07-21 NOTE — Discharge Instructions (Addendum)
Take meloxicam once daily for pain and inflammation. 

## 2022-07-21 NOTE — ED Triage Notes (Signed)
Pt sts that he has been having right knee swelling. Pt sts that it feels like there is fluid on top of the knee. Pt endorses limited mobility of the knee. Pt also sts that it hurts to walk.

## 2022-09-04 DEATH — deceased
